# Patient Record
Sex: Female | Born: 1980 | Race: Black or African American | Hispanic: No | Smoking: Current every day smoker
Health system: Southern US, Community
[De-identification: ages and names within clinical notes are randomized; demographics above are authoritative.]

## PROBLEM LIST (undated history)

## (undated) DIAGNOSIS — N920 Excessive and frequent menstruation with regular cycle: Secondary | ICD-10-CM

## (undated) DIAGNOSIS — D649 Anemia, unspecified: Secondary | ICD-10-CM

---
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
## 1999-02-01 ENCOUNTER — Inpatient Hospital Stay (HOSPITAL_COMMUNITY): Admission: AD | Admit: 1999-02-01 | Discharge: 1999-02-01 | Payer: Self-pay | Admitting: *Deleted

## 1999-03-22 ENCOUNTER — Encounter: Admission: RE | Admit: 1999-03-22 | Discharge: 1999-06-20 | Payer: Self-pay | Admitting: Family Medicine

## 1999-04-26 ENCOUNTER — Inpatient Hospital Stay (HOSPITAL_COMMUNITY): Admission: AD | Admit: 1999-04-26 | Discharge: 1999-04-26 | Payer: Self-pay | Admitting: *Deleted

## 1999-07-24 ENCOUNTER — Inpatient Hospital Stay (HOSPITAL_COMMUNITY): Admission: AD | Admit: 1999-07-24 | Discharge: 1999-07-24 | Payer: Self-pay | Admitting: *Deleted

## 1999-10-15 ENCOUNTER — Inpatient Hospital Stay (HOSPITAL_COMMUNITY): Admission: EM | Admit: 1999-10-15 | Discharge: 1999-10-15 | Payer: Self-pay | Admitting: *Deleted

## 2000-11-04 ENCOUNTER — Emergency Department (HOSPITAL_COMMUNITY): Admission: EM | Admit: 2000-11-04 | Discharge: 2000-11-05 | Payer: Self-pay | Admitting: Emergency Medicine

## 2000-11-05 ENCOUNTER — Encounter: Payer: Self-pay | Admitting: Emergency Medicine

## 2001-01-12 ENCOUNTER — Inpatient Hospital Stay (HOSPITAL_COMMUNITY): Admission: AD | Admit: 2001-01-12 | Discharge: 2001-01-12 | Payer: Self-pay | Admitting: *Deleted

## 2001-05-22 ENCOUNTER — Emergency Department (HOSPITAL_COMMUNITY): Admission: EM | Admit: 2001-05-22 | Discharge: 2001-05-22 | Payer: Self-pay | Admitting: Internal Medicine

## 2002-02-16 ENCOUNTER — Encounter: Payer: Self-pay | Admitting: Emergency Medicine

## 2002-02-16 ENCOUNTER — Emergency Department (HOSPITAL_COMMUNITY): Admission: EM | Admit: 2002-02-16 | Discharge: 2002-02-16 | Payer: Self-pay | Admitting: *Deleted

## 2002-06-23 ENCOUNTER — Encounter: Payer: Self-pay | Admitting: Family Medicine

## 2002-06-23 ENCOUNTER — Ambulatory Visit (HOSPITAL_COMMUNITY): Admission: RE | Admit: 2002-06-23 | Discharge: 2002-06-23 | Payer: Self-pay | Admitting: Family Medicine

## 2002-07-19 ENCOUNTER — Other Ambulatory Visit: Admission: RE | Admit: 2002-07-19 | Discharge: 2002-07-19 | Payer: Self-pay | Admitting: Obstetrics & Gynecology

## 2002-07-19 ENCOUNTER — Other Ambulatory Visit: Admission: RE | Admit: 2002-07-19 | Discharge: 2002-07-19 | Payer: Self-pay | Admitting: Obstetrics and Gynecology

## 2003-02-09 ENCOUNTER — Emergency Department (HOSPITAL_COMMUNITY): Admission: EM | Admit: 2003-02-09 | Discharge: 2003-02-10 | Payer: Self-pay | Admitting: Emergency Medicine

## 2003-07-05 ENCOUNTER — Inpatient Hospital Stay (HOSPITAL_COMMUNITY): Admission: AD | Admit: 2003-07-05 | Discharge: 2003-07-08 | Payer: Self-pay | Admitting: *Deleted

## 2003-08-16 ENCOUNTER — Other Ambulatory Visit: Admission: RE | Admit: 2003-08-16 | Discharge: 2003-08-16 | Payer: Self-pay | Admitting: *Deleted

## 2006-01-14 ENCOUNTER — Emergency Department (HOSPITAL_COMMUNITY): Admission: EM | Admit: 2006-01-14 | Discharge: 2006-01-14 | Payer: Self-pay | Admitting: Family Medicine

## 2006-06-12 ENCOUNTER — Emergency Department (HOSPITAL_COMMUNITY): Admission: EM | Admit: 2006-06-12 | Discharge: 2006-06-12 | Payer: Self-pay | Admitting: Family Medicine

## 2006-06-15 ENCOUNTER — Emergency Department (HOSPITAL_COMMUNITY): Admission: EM | Admit: 2006-06-15 | Discharge: 2006-06-16 | Payer: Self-pay | Admitting: Emergency Medicine

## 2007-05-08 ENCOUNTER — Emergency Department (HOSPITAL_COMMUNITY): Admission: EM | Admit: 2007-05-08 | Discharge: 2007-05-09 | Payer: Self-pay | Admitting: Emergency Medicine

## 2007-09-07 ENCOUNTER — Ambulatory Visit (HOSPITAL_COMMUNITY): Admission: RE | Admit: 2007-09-07 | Discharge: 2007-09-07 | Payer: Self-pay | Admitting: Obstetrics & Gynecology

## 2007-09-07 HISTORY — PX: IUD REMOVAL: SHX5392

## 2007-09-07 HISTORY — PX: TUBAL LIGATION: SHX77

## 2008-04-22 ENCOUNTER — Ambulatory Visit (HOSPITAL_COMMUNITY): Admission: RE | Admit: 2008-04-22 | Discharge: 2008-04-22 | Payer: Self-pay | Admitting: Obstetrics

## 2008-04-22 ENCOUNTER — Encounter: Payer: Self-pay | Admitting: Obstetrics

## 2008-04-22 HISTORY — PX: LEEP: SHX91

## 2010-12-18 ENCOUNTER — Emergency Department (HOSPITAL_COMMUNITY)
Admission: EM | Admit: 2010-12-18 | Discharge: 2010-12-18 | Payer: Self-pay | Source: Home / Self Care | Admitting: Emergency Medicine

## 2011-04-09 NOTE — Op Note (Signed)
NAMEMARGE, VANDERMEULEN               ACCOUNT NO.:  [id number]   MEDICAL RECORD NO.:  [id number]          PATIENT TYPE:  AMB   LOCATION:  SDC                           [facility]ILITY:  [facility]   PHYSICIAN:  Roseanna Rainbow, M.D.DATE OF BIRTH:  Apr 10, 1981   DATE OF PROCEDURE:  09/07/2007  DATE OF DISCHARGE:                               OPERATIVE REPORT   PREOPERATIVE DIAGNOSIS:  Multiparity, desires sterilization procedure.   POSTOPERATIVE DIAGNOSIS:  Multiparity, desires sterilization procedure.   PROCEDURES:  1. Intrauterine device removal.  2. Laparoscopic bilateral tubal ligation with fulguration.   SURGEON:  Roseanna Rainbow, MD   ANESTHESIA:  General endotracheal.   ESTIMATED BLOOD LOSS:  Minimal.   COMPLICATIONS:  None.   PROCEDURE:  The patient is taken to the operating room with an IV  running.  She was given general anesthesia and placed in the dorsal  lithotomy position and prepped and draped in the usual sterile fashion.  After a time-out had been performed, a bivalve speculum was placed in  the patient's vagina.  The IUD strings were grasped and the IUD was  removed intact.  A single-tooth tenaculum was applied to the anterior  lip of the cervix.  The Hulka manipulator was then advanced into the  uterus and secured to the anterior lip of the cervix as a means to  manipulate the uterus.  The single-tooth tenaculum was then removed as  well as the speculum.  An infraumbilical skin incision was then made.  Using the OptiView trocar, the trocar was then advanced into the  peritoneal cavity under direct visualization.  The abdomen was then  insufflated with CO2 gas.  A survey of the pelvic anatomy revealed small  peritoneal windows in the posterior cul-de-sac near the uterosacral  ligaments bilaterally.  A 2-cm segment of the mid isthmic portion of the  right fallopian tube was cauterized contiguously.  With each application  the ohmmeter was noted to go to 0.  The  left fallopian tube was  manipulated in a similar fashion.  The instruments were removed from the  abdomen.  The skin was closed with interrupted sutures of 3-0 Vicryl and  Dermabond.  The Hulka manipulator was then removed with minimal bleeding  noted from the cervix.  At the close of the procedure the instrument and  pack counts were said to be correct x2.  The patient was taken to the  PACU awake and in stable condition.      Roseanna Rainbow, M.D.  Electronically Signed     LAJ/MEDQ  D:  09/07/2007  T:  09/08/2007  Job:  [id number]

## 2011-04-09 NOTE — Op Note (Signed)
[name], Erika               ACCOUNT NO.:  [id number]   MEDICAL RECORD NO.:  [id number]          PATIENT TYPE:  AMB   LOCATION:  SDC                           [facility]:  [facility]   PHYSICIAN:  Charles A. Clearance Coots, M.D.DATE OF BIRTH:  Feb 09, 1981   DATE OF PROCEDURE:  04/22/2008  DATE OF DISCHARGE:                               OPERATIVE REPORT   PREOPERATIVE DIAGNOSIS:  CIN II-III.   POSTOPERATIVE DIAGNOSIS:  CIN II-III.   PROCEDURE:  LEEP.   SURGEON:  Charles A.  Clearance Coots, MD   ANESTHESIA:  General.   ESTIMATED BLOOD LOSS:  Minimal.   SPECIMEN:  Conization of cervix.   DISPOSITION OF SPECIMEN:  Pathology.   OPERATION IN DETAIL:  The patient was brought to the operating room and  after satisfactory general endotracheal anesthesia, the vagina was  prepped and draped in the usual sterile fashion.  A sterile LEEP  speculum was inserted in the vaginal vault and the cervix was isolated.  The 20 x 12 loop was then used to perform the LEEP conization at a  wattage of 50 watts cutting and 35 watts coag.  The conization specimen  was obtained in routine fashion without complications and submitted to  pathology for evaluation.  The edges of the cervical base were then  feathered with ball-tip electrode and the inner aspect of the base of  the cervical cone was cauterized with the ball-tip electrode to obtain  adequate hemostasis.  There was no active bleeding at the conclusion of  the procedure.  The patient tolerated the procedure well and was  transported to recovery room in satisfactory condition.      Charles A. Clearance Coots, M.D.  Electronically Signed     CAH/MEDQ  D:  04/22/2008  T:  04/23/2008  Job:  [id number]

## 2011-04-12 NOTE — Consult Note (Signed)
NAME:  Erika [name], Erika [name]                         ACCOUNT NO.:  [id number]   MEDICAL RECORD NO.:  [id number]                   PATIENT TYPE:  MAT   LOCATION:  MATC                                 [facility]:  [facility]   PHYSICIAN:  James A. Ashley Royalty, M.D.             DATE OF BIRTH:  1981/01/20   DATE OF CONSULTATION:  02/09/2003  DATE OF DISCHARGE:                                   CONSULTATION   CHIEF COMPLAINT:  Chest pain.   HISTORY OF PRESENT ILLNESS:  This is a 30 year old gravida 3, para 1, AB 1,  at 20 weeks, one day gestation.  She presented complaining of onset of chest  pain 6:30 p.m. on February 08, 2003.  She states she called the office of Dr.  Debby Bud today with the complaints and was asked to come to the office.  However, she states her job would not allow her to leave, and hence, she  presented at or about 7 p.m. unannounced at maternity admissions unit.  She  states the pain is mostly beneath her sternum, intermittent, radiating at  times to the right and other times to the left.  At no times does it radiate  to the neck or to the left arm.  She denies any orthopnea, edema, shortness  of breath, cough, sputum production, exposures, or any relationship to  eating whatsoever.  She does state it is worse with deep inspiration.   MEDICATIONS:  Vitamins.   PAST MEDICAL HISTORY:  Negative.   PAST SURGICAL HISTORY:  Negative.   ALLERGIES:  No known drug allergies.   FAMILY HISTORY:  Noncontributory.   SOCIAL HISTORY:  The patient denies the use of tobacco or alcohol.   REVIEW OF SYSTEMS:  Noncontributory.   PHYSICAL EXAMINATION:  GENERAL:  Well-developed, well-nourished, pleasant  black female, no acute distress.  VITAL SIGNS:  Temperature 98 degrees Fahrenheit, pulse 68, respirations 18,  blood pressure 134/74.  HEENT:  Normocephalic.  NECK:  Supple without thyromegaly or _____________.  LUNGS:  Clear to auscultation.  CARDIAC:  Regular rate and rhythm without murmurs,  gallops, or rubs.  BREASTS:  Exam deferred.  ABDOMEN:  Soft and nontender.  The fundus is at approximately the level of  the umbilicus.  Fetal heart tones are auscultated with Doppler.  PELVIC:  Examination is deferred.   IMPRESSION:  1. Intrauterine pregnancy at 20 weeks' gestation.  2. Chest pain - appears to be musculoskeletal in nature.  Differential     includes cardiac origin or infectious etiology (doubt).   PLAN:  I have discussed with the patient the options of obtaining additional  lab work at this facility, possibly allowing her to go home with a short  course of analgesics if indicated versus further evaluation at sister campus  such as [facility].  She agrees to the latter.  Hence, we will obtain  transfer via [facility].  Will be available for any  questions presented by the  emergency room physicians at [facility].                                                      James A. Ashley Royalty, M.D.    JAM/MEDQ  D:  02/09/2003  T:  02/09/2003  Job:  [id number]   cc:   Fayrene Fearing A. Ashley Royalty, M.D.  [address]  [place], [place] [address]  Fax: [phone]   Rosalyn Gess. Norins, M.D. [facility]

## 2011-08-21 LAB — PREGNANCY, URINE: Preg Test, Ur: NEGATIVE

## 2011-08-21 LAB — CBC
HCT: 35.2 — ABNORMAL LOW
Hemoglobin: 11.8 — ABNORMAL LOW
MCHC: 33.5
MCV: 69 — ABNORMAL LOW
Platelets: 311
RBC: 5.1
RDW: 13.9
WBC: 9.1

## 2011-09-05 LAB — PREGNANCY, URINE: Preg Test, Ur: NEGATIVE

## 2011-09-05 LAB — CBC
HCT: 33.7 — ABNORMAL LOW
Hemoglobin: 11.2 — ABNORMAL LOW
MCHC: 33.2
MCV: 69.6 — ABNORMAL LOW
Platelets: 351
RBC: 4.84
RDW: 14.6 — ABNORMAL HIGH
WBC: 6.9

## 2011-09-12 LAB — URINALYSIS, ROUTINE W REFLEX MICROSCOPIC
Bilirubin Urine: NEGATIVE
Glucose, UA: NEGATIVE
Hgb urine dipstick: NEGATIVE
Ketones, ur: 15 — AB
Nitrite: NEGATIVE
Protein, ur: NEGATIVE
Specific Gravity, Urine: 1.028
Urobilinogen, UA: 0.2
pH: 5.5

## 2011-09-12 LAB — WET PREP, GENITAL
Clue Cells Wet Prep HPF POC: NONE SEEN
Trich, Wet Prep: NONE SEEN
Yeast Wet Prep HPF POC: NONE SEEN

## 2011-09-12 LAB — PREGNANCY, URINE: Preg Test, Ur: NEGATIVE

## 2011-09-12 LAB — GC/CHLAMYDIA PROBE AMP, GENITAL
Chlamydia, DNA Probe: NEGATIVE
GC Probe Amp, Genital: NEGATIVE

## 2012-06-24 ENCOUNTER — Emergency Department (HOSPITAL_COMMUNITY)
Admission: EM | Admit: 2012-06-24 | Discharge: 2012-06-24 | Disposition: A | Discharge status: Home / Self Care | Payer: Self-pay | Attending: Emergency Medicine | Admitting: Emergency Medicine

## 2012-06-24 ENCOUNTER — Encounter (HOSPITAL_COMMUNITY): Payer: Self-pay

## 2012-06-24 DIAGNOSIS — Z87891 Personal history of nicotine dependence: Secondary | ICD-10-CM | POA: Insufficient documentation

## 2012-06-24 DIAGNOSIS — N12 Tubulo-interstitial nephritis, not specified as acute or chronic: Secondary | ICD-10-CM | POA: Insufficient documentation

## 2012-06-24 DIAGNOSIS — I10 Essential (primary) hypertension: Secondary | ICD-10-CM | POA: Insufficient documentation

## 2012-06-24 LAB — URINALYSIS, ROUTINE W REFLEX MICROSCOPIC
Bilirubin Urine: NEGATIVE
Glucose, UA: NEGATIVE mg/dL
Ketones, ur: 15 mg/dL — AB
Nitrite: POSITIVE — AB
Protein, ur: 100 mg/dL — AB
Specific Gravity, Urine: 1.017 (ref 1.005–1.030)
Urobilinogen, UA: 0.2 mg/dL (ref 0.0–1.0)
pH: 5.5 (ref 5.0–8.0)

## 2012-06-24 LAB — URINE MICROSCOPIC-ADD ON

## 2012-06-24 LAB — PREGNANCY, URINE: Preg Test, Ur: NEGATIVE

## 2012-06-24 MED ORDER — HYDROCODONE-ACETAMINOPHEN 5-500 MG PO TABS: 1.0000 | ORAL_TABLET | Freq: Four times a day (QID) | ORAL | Status: AC | PRN

## 2012-06-24 MED ORDER — IBUPROFEN 600 MG PO TABS: 600.0000 mg | ORAL_TABLET | Freq: Four times a day (QID) | ORAL | Status: AC | PRN

## 2012-06-24 MED ORDER — CIPROFLOXACIN HCL 500 MG PO TABS
500.0000 mg | ORAL_TABLET | Freq: Once | ORAL | Status: AC
  Administered 2012-06-24: 500 mg via ORAL
  Filled 2012-06-24: qty 1

## 2012-06-24 MED ORDER — PHENAZOPYRIDINE HCL 200 MG PO TABS: 200.0000 mg | ORAL_TABLET | Freq: Three times a day (TID) | ORAL | Status: AC

## 2012-06-24 MED ORDER — CIPROFLOXACIN HCL 500 MG PO TABS: 500.0000 mg | ORAL_TABLET | Freq: Two times a day (BID) | ORAL | Status: AC

## 2012-06-24 MED ORDER — PHENAZOPYRIDINE HCL 200 MG PO TABS
200.0000 mg | ORAL_TABLET | Freq: Three times a day (TID) | ORAL | Status: DC
  Administered 2012-06-24: 200 mg via ORAL
  Filled 2012-06-24: qty 1

## 2012-06-24 NOTE — ED Notes (Signed)
Patient reports that she is having dysuria, odor to urine, and right low back pain with nausea last night. Patient denies fever.

## 2012-06-24 NOTE — ED Provider Notes (Signed)
History     CSN: [id number]  Arrival date & time 06/24/12  1241   First MD Initiated Contact with Patient 06/24/12 1357      Chief Complaint  Patient presents with  . Urinary Frequency  . Nausea  . Back Pain    (Consider location/radiation/quality/duration/timing/severity/associated sxs/prior treatment) HPI Comments: Erika [name] is a 31 y.o. Female who presents with urinary frequency, right flank pain, nausea, chills. Pt states she first started having urinary symptoms about a month ago, but they improved with no treatment. States in the last two days having right flank pain, nausea, pain with movement, urination. No fever, no vomiting. No vaginal discharge or bleeding. Not pregnant. Pt did not take any medication prior to arrival.    The history is provided by the patient.    Past Medical History  Diagnosis Date  . Hypertension     Past Surgical History  Procedure Date  . Tubal ligation   . Iud removal     Family History  Problem Relation Age of Onset  . Cancer Mother   . Diabetes Father     History  Substance Use Topics  . Smoking status: Former Games developer  . Smokeless tobacco: Not on file  . Alcohol Use: Yes     occasionally    OB History    Grav Para Term Preterm Abortions TAB SAB Ect Mult Living                  Review of Systems  Constitutional: Negative for fever and chills.  HENT: Negative for neck pain and neck stiffness.   Respiratory: Negative for cough, choking and chest tightness.   Gastrointestinal: Positive for abdominal pain. Negative for nausea and vomiting.  Genitourinary: Positive for dysuria, urgency, frequency and flank pain. Negative for hematuria, vaginal bleeding, vaginal discharge and difficulty urinating.  Musculoskeletal: Positive for back pain.  Skin: Negative.   Neurological: Negative for weakness, numbness and headaches.    Allergies  Review of patient's allergies indicates no known allergies.  Home Medications    Current Outpatient Rx  Name Route Sig Dispense Refill  . IBUPROFEN 200 MG PO TABS Oral Take 200 mg by mouth every 6 (six) hours as needed.      BP 146/84  Pulse 92  Temp 98.1 F (36.7 C) (Oral)  Resp 16  SpO2 100%  LMP 04/27/2012  Physical Exam  Nursing note and vitals reviewed. Constitutional: She is oriented to person, place, and time. She appears well-developed and well-nourished. No distress.  HENT:  Head: Normocephalic.  Eyes: Conjunctivae are normal.  Neck: Neck supple.  Cardiovascular: Normal rate, regular rhythm and normal heart sounds.   Pulmonary/Chest: Effort normal and breath sounds normal. No respiratory distress. She has no wheezes. She has no rales.  Abdominal: Soft. Bowel sounds are normal. She exhibits no distension. There is tenderness. There is no rebound.       Suprapubic tenderness, no guarding. Right CVA tenderness  Musculoskeletal: She exhibits no edema.  Neurological: She is alert and oriented to person, place, and time.  Skin: Skin is warm and dry.  Psychiatric: She has a normal mood and affect.    ED Course  Procedures (including critical care time)  Pt with urinary symptoms. Pain with right CVA percussion. VS normal. No vomiting, afebrile. Suspect early pyelo, urine infected  Results for orders placed during the hospital encounter of 06/24/12  URINALYSIS, ROUTINE W REFLEX MICROSCOPIC      Component Value Range  Color, Urine YELLOW  YELLOW   APPearance CLOUDY (*) CLEAR   Specific Gravity, Urine 1.017  1.005 - 1.030   pH 5.5  5.0 - 8.0   Glucose, UA NEGATIVE  NEGATIVE mg/dL   Hgb urine dipstick LARGE (*) NEGATIVE   Bilirubin Urine NEGATIVE  NEGATIVE   Ketones, ur 15 (*) NEGATIVE mg/dL   Protein, ur 409 (*) NEGATIVE mg/dL   Urobilinogen, UA 0.2  0.0 - 1.0 mg/dL   Nitrite POSITIVE (*) NEGATIVE   Leukocytes, UA LARGE (*) NEGATIVE  URINE MICROSCOPIC-ADD ON      Component Value Range   Squamous Epithelial / LPF RARE  RARE   WBC, UA TOO  NUMEROUS TO COUNT  <3 WBC/hpf   RBC / HPF 3-6  <3 RBC/hpf   Bacteria, UA MANY (*) RARE   No results found.  Will send cultures, will treat with antibiotics, pain medications, follow up closely. Instructed to return if worsening. PT denies vaginal symptoms and does not wish to have pelvic exam done at this time.   1. Pyelonephritis       MDM          Lottie Mussel, PA 06/25/12 [phone]

## 2012-06-26 LAB — URINE CULTURE: Colony Count: >=100000

## 2012-06-26 NOTE — ED Provider Notes (Signed)
Medical screening examination/treatment/procedure(s) were performed by non-physician practitioner and as supervising physician I was immediately available for consultation/collaboration.  Malan Werk, MD 06/26/12 1319 

## 2012-06-27 NOTE — ED Notes (Signed)
+  Urine. Patient treated with Cipro. Sensitive to same. Per protocol MD. °

## 2012-08-05 ENCOUNTER — Inpatient Hospital Stay (HOSPITAL_COMMUNITY)
Admission: AD | Admit: 2012-08-05 | Discharge: 2012-08-05 | Disposition: A | Discharge status: Home / Self Care | Payer: Self-pay | Source: Ambulatory Visit | Attending: Obstetrics & Gynecology | Admitting: Obstetrics & Gynecology

## 2012-08-05 ENCOUNTER — Encounter: Payer: Self-pay | Admitting: *Deleted

## 2012-08-05 DIAGNOSIS — N949 Unspecified condition associated with female genital organs and menstrual cycle: Secondary | ICD-10-CM | POA: Insufficient documentation

## 2012-08-05 DIAGNOSIS — R102 Pelvic and perineal pain: Secondary | ICD-10-CM

## 2012-08-05 DIAGNOSIS — R109 Unspecified abdominal pain: Secondary | ICD-10-CM | POA: Insufficient documentation

## 2012-08-05 DIAGNOSIS — N72 Inflammatory disease of cervix uteri: Secondary | ICD-10-CM

## 2012-08-05 LAB — URINALYSIS, ROUTINE W REFLEX MICROSCOPIC
Bilirubin Urine: NEGATIVE
Glucose, UA: NEGATIVE mg/dL
Ketones, ur: NEGATIVE mg/dL
Leukocytes, UA: NEGATIVE
Nitrite: NEGATIVE
Protein, ur: NEGATIVE mg/dL
Specific Gravity, Urine: 1.025 (ref 1.005–1.030)
Urobilinogen, UA: 0.2 mg/dL (ref 0.0–1.0)
pH: 6 (ref 5.0–8.0)

## 2012-08-05 LAB — CBC WITH DIFFERENTIAL/PLATELET
Basophils Absolute: 0 10*3/uL (ref 0.0–0.1)
Basophils Relative: 0 % (ref 0–1)
Eosinophils Absolute: 0.5 10*3/uL (ref 0.0–0.7)
Eosinophils Relative: 7 % — ABNORMAL HIGH (ref 0–5)
HCT: 30.4 % — ABNORMAL LOW (ref 36.0–46.0)
Hemoglobin: 10.1 g/dL — ABNORMAL LOW (ref 12.0–15.0)
Lymphocytes Relative: 33 % (ref 12–46)
Lymphs Abs: 2.7 10*3/uL (ref 0.7–4.0)
MCH: 21.7 pg — ABNORMAL LOW (ref 26.0–34.0)
MCHC: 33.2 g/dL (ref 30.0–36.0)
MCV: 65.2 fL — ABNORMAL LOW (ref 78.0–100.0)
Monocytes Absolute: 0.6 10*3/uL (ref 0.1–1.0)
Monocytes Relative: 7 % (ref 3–12)
Neutro Abs: 4.3 10*3/uL (ref 1.7–7.7)
Neutrophils Relative %: 53 % (ref 43–77)
Platelets: 349 10*3/uL (ref 150–400)
RBC: 4.66 MIL/uL (ref 3.87–5.11)
RDW: 15 % (ref 11.5–15.5)
WBC: 8.1 10*3/uL (ref 4.0–10.5)

## 2012-08-05 LAB — URINE MICROSCOPIC-ADD ON

## 2012-08-05 LAB — WET PREP, GENITAL
Trich, Wet Prep: NONE SEEN
Yeast Wet Prep HPF POC: NONE SEEN

## 2012-08-05 MED ORDER — AZITHROMYCIN 250 MG PO TABS
1000.0000 mg | ORAL_TABLET | Freq: Once | ORAL | Status: AC
  Administered 2012-08-05: 1000 mg via ORAL
  Filled 2012-08-05: qty 4

## 2012-08-05 MED ORDER — CEFTRIAXONE SODIUM 250 MG IJ SOLR
250.0000 mg | Freq: Once | INTRAMUSCULAR | Status: AC
  Administered 2012-08-05: 250 mg via INTRAMUSCULAR
  Filled 2012-08-05: qty 250

## 2012-08-05 MED ORDER — KETOROLAC TROMETHAMINE 60 MG/2ML IM SOLN
30.0000 mg | Freq: Once | INTRAMUSCULAR | Status: AC
  Administered 2012-08-05: 30 mg via INTRAMUSCULAR
  Filled 2012-08-05: qty 2

## 2012-08-05 MED ORDER — HYDROCODONE-ACETAMINOPHEN 5-325 MG PO TABS: 1.0000 | ORAL_TABLET | ORAL | Status: AC | PRN

## 2012-08-05 NOTE — MAU Note (Signed)
Pt presents with complaint of "I am having some abnormal cramping." states just this pm/am. Denies dysuria, LMP 07/30/2012, nausea

## 2012-08-05 NOTE — MAU Provider Note (Signed)
History     CSN: [id number]  Arrival date and time: 08/05/12 0207   First Provider Initiated Contact with Patient 08/05/12 0256      Chief Complaint  Patient presents with  . Abdominal Pain   HPI Erika [name] is a 31 y.o. female who presents to MAU with abdominal pain. She describes the pain as cramping. The pain started tonight approximately 8 pm but has gotten worse. LMP 07/30/12. No birth control. Sexually active, current sex partner x 6 months. No pain with intercourse, last intercourse 2 days ago. Tubal ligation for birth control. Last pap smear 6 years ago and was abnormal. Never went back for follow up due to loss of insurance.  The history was provided by the patient.  OB History    Grav Para Term Preterm Abortions TAB SAB Ect Mult Living                  Past Medical History  Diagnosis Date  . Hypertension     Past Surgical History  Procedure Date  . Tubal ligation   . Iud removal     Family History  Problem Relation Age of Onset  . Cancer Mother   . Diabetes Father     History  Substance Use Topics  . Smoking status: Former Games developer  . Smokeless tobacco: Not on file  . Alcohol Use: Yes     occasionally    Allergies: No Known Allergies  Prescriptions prior to admission  Medication Sig Dispense Refill  . ibuprofen (ADVIL,MOTRIN) 200 MG tablet Take 200 mg by mouth every 6 (six) hours as needed.        Review of Systems  Constitutional: Negative for fever, chills and weight loss.  HENT: Negative for ear pain, nosebleeds, congestion, sore throat and neck pain.   Eyes: Negative for blurred vision, double vision, photophobia and pain.  Respiratory: Negative for cough, shortness of breath and wheezing.   Cardiovascular: Negative for chest pain, palpitations and leg swelling.  Gastrointestinal: Positive for heartburn and abdominal pain. Negative for nausea, vomiting, diarrhea and constipation.       Vaginal discharge and spotting  Genitourinary: Positive  for frequency. Negative for dysuria and urgency.  Musculoskeletal: Positive for back pain. Negative for myalgias.  Skin: Negative for itching and rash.  Neurological: Positive for headaches. Negative for dizziness, sensory change, speech change, seizures and weakness.  Endo/Heme/Allergies: Does not bruise/bleed easily.  Psychiatric/Behavioral: Negative for depression. The patient is not nervous/anxious and does not have insomnia.    Physical Exam   Blood pressure 149/90, pulse 79, temperature 98.1 F (36.7 C), temperature source Oral, resp. rate 18, height 5\' 4"  (1.626 m), weight 159 lb (72.122 kg), SpO2 100.00%.  Physical Exam  Nursing note and vitals reviewed. Constitutional: She is oriented to person, place, and time. She appears well-developed and well-nourished. No distress.  HENT:  Head: Normocephalic and atraumatic.  Eyes: EOM are normal.  Neck: Neck supple.  Cardiovascular: Normal rate.   Respiratory: Effort normal.  GI: Soft. There is no tenderness.  Genitourinary:       External genitalia without lesions. Frothy blood tinged discharge vaginal vault. Cervix inflamed, positive CMT, mild bilateral adnexal tenderness. Uterus without palpable enlargement.  Musculoskeletal: Normal range of motion.  Neurological: She is alert and oriented to person, place, and time.  Skin: Skin is warm and dry.  Psychiatric: She has a normal mood and affect. Her behavior is normal. Judgment and thought content normal.   Results for  orders placed during the hospital encounter of 08/05/12 (from the past 24 hour(s))  URINALYSIS, ROUTINE W REFLEX MICROSCOPIC     Status: Abnormal   Collection Time   08/05/12  1:40 AM      Component Value Range   Color, Urine YELLOW  YELLOW   APPearance CLEAR  CLEAR   Specific Gravity, Urine 1.025  1.005 - 1.030   pH 6.0  5.0 - 8.0   Glucose, UA NEGATIVE  NEGATIVE mg/dL   Hgb urine dipstick SMALL (*) NEGATIVE   Bilirubin Urine NEGATIVE  NEGATIVE   Ketones, ur  NEGATIVE  NEGATIVE mg/dL   Protein, ur NEGATIVE  NEGATIVE mg/dL   Urobilinogen, UA 0.2  0.0 - 1.0 mg/dL   Nitrite NEGATIVE  NEGATIVE   Leukocytes, UA NEGATIVE  NEGATIVE  URINE MICROSCOPIC-ADD ON     Status: Abnormal   Collection Time   08/05/12  1:40 AM      Component Value Range   Squamous Epithelial / LPF FEW (*) RARE   WBC, UA 0-2  <3 WBC/hpf   RBC / HPF 3-6  <3 RBC/hpf   Bacteria, UA FEW (*) RARE  CBC WITH DIFFERENTIAL     Status: Abnormal   Collection Time   08/05/12  3:20 AM      Component Value Range   WBC 8.1  4.0 - 10.5 K/uL   RBC 4.66  3.87 - 5.11 MIL/uL   Hemoglobin 10.1 (*) 12.0 - 15.0 g/dL   HCT 16.1 (*) 09.6 - 04.5 %   MCV 65.2 (*) 78.0 - 100.0 fL   MCH 21.7 (*) 26.0 - 34.0 pg   MCHC 33.2  30.0 - 36.0 g/dL   RDW 40.9  81.1 - 91.4 %   Platelets 349  150 - 400 K/uL   Neutrophils Relative 53  43 - 77 %   Neutro Abs 4.3  1.7 - 7.7 K/uL   Lymphocytes Relative 33  12 - 46 %   Lymphs Abs 2.7  0.7 - 4.0 K/uL   Monocytes Relative 7  3 - 12 %   Monocytes Absolute 0.6  0.1 - 1.0 K/uL   Eosinophils Relative 7 (*) 0 - 5 %   Eosinophils Absolute 0.5  0.0 - 0.7 K/uL   Basophils Relative 0  0 - 1 %   Basophils Absolute 0.0  0.0 - 0.1 K/uL   Assessment: 31 y.o. female with pelvic pain   Cervcitis  Plan:  Rocephin 250 mg IM   Zithromax 1 gram po   Follow up in GYN clinic Discussed with the patient and all questioned fully answered. She will follow up in GYN Clinic or return here if any problems arise.   Medication List     As of 08/08/2012  7:58 AM    START taking these medications         HYDROcodone-acetaminophen 5-325 MG per tablet   Commonly known as: NORCO/VICODIN   Take 1 tablet by mouth every 4 (four) hours as needed for pain.      CONTINUE taking these medications         ibuprofen 200 MG tablet   Commonly known as: ADVIL,MOTRIN          Where to get your medications    These are the prescriptions that you need to pick up.   You may get these  medications from any pharmacy.         HYDROcodone-acetaminophen 5-325 MG per tablet  Procedures   Dupree Givler, RN, FNP, [facility] 08/05/2012, 2:56 AM

## 2012-08-06 LAB — GC/CHLAMYDIA PROBE AMP, GENITAL
Chlamydia, DNA Probe: NEGATIVE
GC Probe Amp, Genital: NEGATIVE

## 2012-08-26 ENCOUNTER — Encounter: Payer: Self-pay | Admitting: Obstetrics & Gynecology

## 2012-09-10 ENCOUNTER — Encounter: Payer: Self-pay | Admitting: Advanced Practice Midwife

## 2012-12-30 ENCOUNTER — Inpatient Hospital Stay (HOSPITAL_COMMUNITY)
Admission: AD | Admit: 2012-12-30 | Discharge: 2012-12-30 | Disposition: A | Discharge status: Home / Self Care | Payer: 59 | Source: Ambulatory Visit | Attending: Obstetrics & Gynecology | Admitting: Obstetrics & Gynecology

## 2012-12-30 ENCOUNTER — Encounter (HOSPITAL_COMMUNITY): Payer: Self-pay

## 2012-12-30 ENCOUNTER — Inpatient Hospital Stay (HOSPITAL_COMMUNITY): Payer: 59

## 2012-12-30 DIAGNOSIS — D259 Leiomyoma of uterus, unspecified: Secondary | ICD-10-CM | POA: Insufficient documentation

## 2012-12-30 DIAGNOSIS — N946 Dysmenorrhea, unspecified: Secondary | ICD-10-CM | POA: Insufficient documentation

## 2012-12-30 DIAGNOSIS — R109 Unspecified abdominal pain: Secondary | ICD-10-CM | POA: Insufficient documentation

## 2012-12-30 LAB — POCT PREGNANCY, URINE: Preg Test, Ur: NEGATIVE

## 2012-12-30 LAB — CBC
HCT: 31 % — ABNORMAL LOW (ref 36.0–46.0)
Hemoglobin: 10.3 g/dL — ABNORMAL LOW (ref 12.0–15.0)
MCH: 21.9 pg — ABNORMAL LOW (ref 26.0–34.0)
MCHC: 33.2 g/dL (ref 30.0–36.0)
MCV: 65.8 fL — ABNORMAL LOW (ref 78.0–100.0)
Platelets: 321 10*3/uL (ref 150–400)
RBC: 4.71 MIL/uL (ref 3.87–5.11)
RDW: 15.5 % (ref 11.5–15.5)
WBC: 7.8 10*3/uL (ref 4.0–10.5)

## 2012-12-30 LAB — URINE MICROSCOPIC-ADD ON

## 2012-12-30 LAB — URINALYSIS, ROUTINE W REFLEX MICROSCOPIC
Bilirubin Urine: NEGATIVE
Glucose, UA: NEGATIVE mg/dL
Ketones, ur: NEGATIVE mg/dL
Leukocytes, UA: NEGATIVE
Nitrite: NEGATIVE
Protein, ur: NEGATIVE mg/dL
Specific Gravity, Urine: >1.03 — ABNORMAL HIGH (ref 1.005–1.030)
Urobilinogen, UA: 0.2 mg/dL (ref 0.0–1.0)
pH: 6 (ref 5.0–8.0)

## 2012-12-30 LAB — WET PREP, GENITAL
Clue Cells Wet Prep HPF POC: NONE SEEN
Trich, Wet Prep: NONE SEEN
Yeast Wet Prep HPF POC: NONE SEEN

## 2012-12-30 LAB — HCG, QUANTITATIVE, PREGNANCY: hCG, Beta Chain, Quant, S: 2 m[IU]/mL (ref <5)

## 2012-12-30 MED ORDER — ACETAMINOPHEN 500 MG PO TABS
1000.0000 mg | ORAL_TABLET | Freq: Once | ORAL | Status: AC
  Administered 2012-12-30: 1000 mg via ORAL
  Filled 2012-12-30: qty 2

## 2012-12-30 NOTE — MAU Note (Signed)
preg symptoms for past 2 wks.  Hx of tubal in 2007,  Pain started this morning- left side only.

## 2012-12-30 NOTE — MAU Provider Note (Signed)
History     CSN: [id number]  Arrival date and time: 12/30/12 1412   First Provider Initiated Contact with Patient 12/30/12 1543      Chief Complaint  Patient presents with  . Abdominal Pain   HPI Erika [name] 32 y.o. Comes to MAU with sudden onset of severe left sided lower abdominal pain for past 2 hours.  Took Ibuprofen 800 mg PO and has had no relief.  Worried that this would be a tubal pregnancy.  Has had pregnancy symptoms x 2 weeks.  OB History    Grav Para Term Preterm Abortions TAB SAB Ect Mult Living   3 2 2  1  1   2       Past Medical History  Diagnosis Date  . Hypertension     Past Surgical History  Procedure Date  . Tubal ligation   . Iud removal     Family History  Problem Relation Age of Onset  . Cancer Mother   . Diabetes Father     History  Substance Use Topics  . Smoking status: Former Games developer  . Smokeless tobacco: Not on file  . Alcohol Use: Yes     Comment: occasionally    Allergies: No Known Allergies  Prescriptions prior to admission  Medication Sig Dispense Refill  . ibuprofen (ADVIL,MOTRIN) 800 MG tablet Take 800 mg by mouth every 8 (eight) hours as needed. pain      . Prenatal Vit-Fe Fumarate-FA (PRENATAL MULTIVITAMIN) TABS Take 1 tablet by mouth daily.        Review of Systems  Constitutional: Negative for fever.  Gastrointestinal: Positive for abdominal pain. Negative for nausea and vomiting.       Mild abdominal pain  Genitourinary:       No vaginal discharge. No vaginal bleeding. No dysuria.   Physical Exam   Blood pressure 122/70, pulse 63, temperature 98.5 F (36.9 C), temperature source Oral, resp. rate 18, height 5\' 4"  (1.626 m), weight 78.291 kg (172 lb 9.6 oz), last menstrual period 12/04/2012.  Physical Exam  Nursing note and vitals reviewed. Constitutional: She is oriented to person, place, and time. She appears well-developed and well-nourished. No distress.  HENT:  Head: Normocephalic.  Eyes: EOM are  normal.  Neck: Neck supple.  GI: Soft. There is tenderness.  Genitourinary:       Speculum exam: Vagina - No discharge, no odor Cervix - No contact bleeding Bimanual exam: Cervix closed Uterus very  Tender with palpation, normal size Adnexa non tender, no masses bilaterally GC/Chlam, wet prep done Chaperone present for exam.  Musculoskeletal: Normal range of motion.  Neurological: She is alert and oriented to person, place, and time.  Skin: Skin is warm and dry.  Psychiatric: She has a normal mood and affect.    MAU Course  Procedures Results for orders placed during the hospital encounter of 12/30/12 (from the past 24 hour(s))  URINALYSIS, ROUTINE W REFLEX MICROSCOPIC     Status: Abnormal   Collection Time   12/30/12  2:29 PM      Component Value Range   Color, Urine YELLOW  YELLOW   APPearance CLEAR  CLEAR   Specific Gravity, Urine >1.030 (*) 1.005 - 1.030   pH 6.0  5.0 - 8.0   Glucose, UA NEGATIVE  NEGATIVE mg/dL   Hgb urine dipstick TRACE (*) NEGATIVE   Bilirubin Urine NEGATIVE  NEGATIVE   Ketones, ur NEGATIVE  NEGATIVE mg/dL   Protein, ur NEGATIVE  NEGATIVE mg/dL  Urobilinogen, UA 0.2  0.0 - 1.0 mg/dL   Nitrite NEGATIVE  NEGATIVE   Leukocytes, UA NEGATIVE  NEGATIVE  URINE MICROSCOPIC-ADD ON     Status: Abnormal   Collection Time   12/30/12  2:29 PM      Component Value Range   Squamous Epithelial / LPF RARE  RARE   WBC, UA 0-2  <3 WBC/hpf   RBC / HPF 0-2  <3 RBC/hpf   Bacteria, UA FEW (*) RARE   Urine-Other MUCOUS PRESENT    POCT PREGNANCY, URINE     Status: Normal   Collection Time   12/30/12  2:42 PM      Component Value Range   Preg Test, Ur NEGATIVE  NEGATIVE  WET PREP, GENITAL     Status: Abnormal   Collection Time   12/30/12  3:50 PM      Component Value Range   Yeast Wet Prep HPF POC NONE SEEN  NONE SEEN   Trich, Wet Prep NONE SEEN  NONE SEEN   Clue Cells Wet Prep HPF POC NONE SEEN  NONE SEEN   WBC, Wet Prep HPF POC FEW (*) NONE SEEN  CBC     Status:  Abnormal   Collection Time   12/30/12  4:10 PM      Component Value Range   WBC 7.8  4.0 - 10.5 K/uL   RBC 4.71  3.87 - 5.11 MIL/uL   Hemoglobin 10.3 (*) 12.0 - 15.0 g/dL   HCT 16.1 (*) 09.6 - 04.5 %   MCV 65.8 (*) 78.0 - 100.0 fL   MCH 21.9 (*) 26.0 - 34.0 pg   MCHC 33.2  30.0 - 36.0 g/dL   RDW 40.9  81.1 - 91.4 %   Platelets 321  150 - 400 K/uL  HCG, QUANTITATIVE, PREGNANCY     Status: Normal   Collection Time   12/30/12  4:10 PM      Component Value Range   hCG, Beta Chain, Quant, S 2  <5 mIU/mL   MDM *RADIOLOGY REPORT*  Clinical Data: Acute onset left-sided abdominal pain.  TRANSABDOMINAL AND TRANSVAGINAL ULTRASOUND OF PELVIS  Technique: Both transabdominal and transvaginal ultrasound  examinations of the pelvis were performed. Transabdominal  technique was performed for global imaging of the pelvis including  uterus, ovaries, adnexal regions, and pelvic cul-de-sac.  It was necessary to proceed with endovaginal exam following the  transabdominal exam to visualize the endometrium and right adnexal  cyst.  Comparison: None.  Findings:  Uterus: 8.4 x 4.8 x 5.5 cm. A tiny 1.0 cm fibroid is seen in the  posterior uterine body. No other fibroids identified.  Endometrium: Double layer thickness measures 6 mm transvaginally.  No focal lesion visualized.  Right ovary: 3.7 x 3.1 x 3.4 cm. A 3 cm follicle is noted. No  ovarian or adnexal mass identified.  Left ovary: 2.8 x 1.7 x 2.3 cm. Normal appearance. No adnexal  mass identified.  Other Findings: No free fluid  IMPRESSION:  1. 1 cm posterior uterine fibroid.  2. 3 cm right ovarian follicle noted. No ovarian or adnexal mass  identified.  Original Report Authenticated By: Myles Rosenthal, M.D.    Assessment and Plan  Abdominal pain - likely beginning dysmenorrhea as menses due in 5 days. Small uterine fibroid  Plan Has appointment scheduled later this month in the office Continue ibuprofen for next 24 hours. Drink at least 8  8-oz glasses of water every day. Increase dietary fiber so stools are soft.  Donnie Gedeon 12/30/2012, 4:00 PM

## 2012-12-30 NOTE — MAU Note (Signed)
Took an ibuprofen isn't touching it.

## 2012-12-31 LAB — GC/CHLAMYDIA PROBE AMP
CT Probe RNA: NEGATIVE
GC Probe RNA: NEGATIVE

## 2013-02-19 ENCOUNTER — Other Ambulatory Visit: Payer: Self-pay | Admitting: *Deleted

## 2013-02-19 NOTE — Telephone Encounter (Signed)
Pt requesting a refill on her birth control pills

## 2013-02-26 ENCOUNTER — Telehealth: Payer: Self-pay | Admitting: *Deleted

## 2013-02-26 NOTE — Telephone Encounter (Signed)
Call to patient- left message that her Rx request for OCP has been forwarded to the pharmacy listed- [facility]

## 2013-03-01 ENCOUNTER — Ambulatory Visit: Payer: Self-pay | Admitting: Obstetrics

## 2013-03-01 NOTE — Telephone Encounter (Signed)
Rx called to the pharmacy per June Leap., RN

## 2013-03-25 ENCOUNTER — Encounter: Payer: Self-pay | Admitting: Obstetrics

## 2013-03-25 ENCOUNTER — Other Ambulatory Visit: Payer: Self-pay | Admitting: Obstetrics

## 2013-03-25 ENCOUNTER — Ambulatory Visit (INDEPENDENT_AMBULATORY_CARE_PROVIDER_SITE_OTHER): Payer: 59 | Admitting: Obstetrics

## 2013-03-25 VITALS — BP 132/90 | HR 90 | Temp 98.7°F | Ht 63.0 in | Wt 165.0 lb

## 2013-03-25 DIAGNOSIS — Z3041 Encounter for surveillance of contraceptive pills: Secondary | ICD-10-CM | POA: Insufficient documentation

## 2013-03-25 DIAGNOSIS — N946 Dysmenorrhea, unspecified: Secondary | ICD-10-CM | POA: Insufficient documentation

## 2013-03-25 DIAGNOSIS — Z01419 Encounter for gynecological examination (general) (routine) without abnormal findings: Secondary | ICD-10-CM

## 2013-03-25 DIAGNOSIS — N390 Urinary tract infection, site not specified: Secondary | ICD-10-CM

## 2013-03-25 LAB — POCT URINALYSIS DIPSTICK
Bilirubin, UA: NEGATIVE
Blood, UA: NEGATIVE
Glucose, UA: NEGATIVE
Ketones, UA: NEGATIVE
Leukocytes, UA: NEGATIVE
Nitrite, UA: NEGATIVE
Protein, UA: NEGATIVE
Spec Grav, UA: 1.015
Urobilinogen, UA: NEGATIVE
pH, UA: 6

## 2013-03-25 MED ORDER — NORGESTIMATE-ETH ESTRADIOL 0.25-35 MG-MCG PO TABS: 1.0000 | ORAL_TABLET | Freq: Every day | ORAL | Status: DC

## 2013-03-25 MED ORDER — IBUPROFEN 600 MG PO TABS: 600.0000 mg | ORAL_TABLET | Freq: Four times a day (QID) | ORAL | Status: DC | PRN

## 2013-03-25 MED ORDER — NITROFURANTOIN MONOHYD MACRO 100 MG PO CAPS: 100.0000 mg | ORAL_CAPSULE | Freq: Two times a day (BID) | ORAL | Status: DC

## 2013-03-25 MED ORDER — HYDROCODONE-IBUPROFEN 5-200 MG PO TABS: 1.0000 | ORAL_TABLET | Freq: Four times a day (QID) | ORAL | Status: DC | PRN

## 2013-03-25 MED ORDER — PRENATAL PLUS IRON 29-1 MG PO TABS: 1.0000 | ORAL_TABLET | Freq: Every day | ORAL | Status: DC

## 2013-03-25 NOTE — Patient Instructions (Signed)
Management of:  Dysmenorrhea Contraception Self Breast Exams Diet and Exercise

## 2013-03-25 NOTE — Progress Notes (Signed)
Subjective:     Erika [name] is a 32 y.o. female here for a routine exam.  Current complaints: Pt in office today for an annual exam. Pt states she is having burning urination and strong odor with urine. Pt states she is also having some pain with intercourse. Pt states she is currently using birth control pills to regulate her menstrual cycles   Personal health questionnaire reviewed: yes.   Gynecologic History Patient's last menstrual period was 03/17/2013. Contraception: OCP (estrogen/progesterone) and tubal ligation Last Pap: 2009. Results were: normal    Pt has history of LEEP in 2009 Last mammogram: n/a. Results were: n/a  Obstetric History OB History   Grav Para Term Preterm Abortions TAB SAB Ect Mult Living   3 2 2  1  1   2      # Outc Date GA Lbr Len/2nd Wgt Sex Del Anes PTL Lv   1 TRM            2 TRM            3 SAB                The following portions of the patient's history were reviewed and updated as appropriate: allergies, current medications, past family history, past medical history, past social history, past surgical history and problem list.  Review of Systems Pertinent items are noted in HPI.    Objective:    General appearance: alert and no distress Breasts: normal appearance, no masses or tenderness Abdomen: normal findings: soft, non-tender Pelvic: cervix normal in appearance, external genitalia normal, no adnexal masses or tenderness, no cervical motion tenderness, uterus normal size, shape, and consistency and vagina normal without discharge    Assessment:    Healthy female exam.  UTI   Plan:    Education reviewed: self breast exams. Macrobid Rx Contraception

## 2013-03-26 LAB — PAP IG W/ RFLX HPV ASCU

## 2013-03-26 LAB — WET PREP BY MOLECULAR PROBE
Candida species: NEGATIVE
Gardnerella vaginalis: POSITIVE — AB
Trichomonas vaginosis: NEGATIVE

## 2013-03-26 LAB — GC/CHLAMYDIA PROBE AMP
CT Probe RNA: NEGATIVE
GC Probe RNA: NEGATIVE

## 2013-03-27 LAB — URINE CULTURE
Colony Count: NO GROWTH
Organism ID, Bacteria: NO GROWTH

## 2013-03-29 ENCOUNTER — Ambulatory Visit: Payer: Self-pay | Admitting: Obstetrics

## 2013-03-31 ENCOUNTER — Other Ambulatory Visit: Payer: Self-pay | Admitting: *Deleted

## 2013-03-31 DIAGNOSIS — B9689 Other specified bacterial agents as the cause of diseases classified elsewhere: Secondary | ICD-10-CM

## 2013-03-31 DIAGNOSIS — N760 Acute vaginitis: Secondary | ICD-10-CM

## 2013-03-31 MED ORDER — TINIDAZOLE 500 MG PO TABS: 1000.0000 mg | ORAL_TABLET | Freq: Every day | ORAL | Status: AC

## 2013-03-31 NOTE — Progress Notes (Signed)
Pt aware of results and prescription sent to pt's pharmacy. Pt expressed understanding.   

## 2013-10-18 ENCOUNTER — Encounter (HOSPITAL_COMMUNITY): Payer: Self-pay | Admitting: *Deleted

## 2013-10-18 ENCOUNTER — Inpatient Hospital Stay (HOSPITAL_COMMUNITY)
Admission: AD | Admit: 2013-10-18 | Discharge: 2013-10-19 | Disposition: A | Discharge status: Home / Self Care | Payer: PRIVATE HEALTH INSURANCE | Source: Ambulatory Visit | Attending: Obstetrics | Admitting: Obstetrics

## 2013-10-18 DIAGNOSIS — N939 Abnormal uterine and vaginal bleeding, unspecified: Secondary | ICD-10-CM

## 2013-10-18 DIAGNOSIS — N926 Irregular menstruation, unspecified: Secondary | ICD-10-CM

## 2013-10-18 DIAGNOSIS — N938 Other specified abnormal uterine and vaginal bleeding: Secondary | ICD-10-CM | POA: Insufficient documentation

## 2013-10-18 DIAGNOSIS — N949 Unspecified condition associated with female genital organs and menstrual cycle: Secondary | ICD-10-CM | POA: Insufficient documentation

## 2013-10-18 DIAGNOSIS — R109 Unspecified abdominal pain: Secondary | ICD-10-CM | POA: Insufficient documentation

## 2013-10-18 HISTORY — DX: Anemia, unspecified: D64.9

## 2013-10-18 LAB — URINALYSIS, ROUTINE W REFLEX MICROSCOPIC
Glucose, UA: NEGATIVE mg/dL
Ketones, ur: NEGATIVE mg/dL
Leukocytes, UA: NEGATIVE
Nitrite: NEGATIVE
Protein, ur: NEGATIVE mg/dL
Specific Gravity, Urine: 1.025 (ref 1.005–1.030)
Urobilinogen, UA: 0.2 mg/dL (ref 0.0–1.0)
pH: 6 (ref 5.0–8.0)

## 2013-10-18 LAB — WET PREP, GENITAL
Trich, Wet Prep: NONE SEEN
Yeast Wet Prep HPF POC: NONE SEEN

## 2013-10-18 LAB — CBC
HCT: 30.1 % — ABNORMAL LOW (ref 36.0–46.0)
Hemoglobin: 10.3 g/dL — ABNORMAL LOW (ref 12.0–15.0)
MCH: 22 pg — ABNORMAL LOW (ref 26.0–34.0)
MCHC: 34.2 g/dL (ref 30.0–36.0)
MCV: 64.3 fL — ABNORMAL LOW (ref 78.0–100.0)
Platelets: 312 10*3/uL (ref 150–400)
RBC: 4.68 MIL/uL (ref 3.87–5.11)
RDW: 14.4 % (ref 11.5–15.5)
WBC: 8 10*3/uL (ref 4.0–10.5)

## 2013-10-18 LAB — URINE MICROSCOPIC-ADD ON

## 2013-10-18 LAB — POCT PREGNANCY, URINE: Preg Test, Ur: NEGATIVE

## 2013-10-18 MED ORDER — KETOROLAC TROMETHAMINE 30 MG/ML IJ SOLN
30.0000 mg | Freq: Once | INTRAMUSCULAR | Status: AC
  Administered 2013-10-18: 30 mg via INTRAMUSCULAR
  Filled 2013-10-18: qty 1

## 2013-10-18 MED ORDER — MEDROXYPROGESTERONE ACETATE 10 MG PO TABS: 10.0000 mg | ORAL_TABLET | Freq: Every day | ORAL | Status: DC

## 2013-10-18 NOTE — MAU Note (Signed)
Pt reports reports she has had bleeding x 3 this months. States the first time was normal for her then 10days later she had bleeding for 4 days and 10 days later (today) she has started spotting again. Cramping since yesterday.

## 2013-10-18 NOTE — MAU Provider Note (Signed)
CC: Vaginal Bleeding and Abdominal Pain    First Provider Initiated Contact with Patient 10/18/13 2303      HPI Erika [name] is a 32 y.o. [id number]  who presents with onset of severe lower abdominal cramping and abnormal bleeding. She describes normal menses 09/23/13, then bleeding for 4 days 10 days after menses stopped. Today began spotting again. Not postcoital. No irritative vaginal discharge. Denies orthostatic symptoms but has history of anemia and takes PNV with iron.  Had irregular cycles in the past describing intervals of 2-6 wks and was on Sprintec for cycle regulation until she stopped taking it 3 months ago.   Past Medical History  Diagnosis Date  . Hypertension   . Anemia   . Abnormal Pap smear   . Fibroid   [place] 12/30/12: 1 cm fundal fibroid  OB History  Gravida Para Term Preterm AB SAB TAB Ectopic Multiple Living  3 2 2  1 1    2     # Outcome Date GA Lbr Len/2nd Weight Sex Delivery Anes PTL Lv  3 SAB           2 TRM           1 TRM               Past Surgical History  Procedure Laterality Date  . Tubal ligation    . Iud removal    . Leep      History   Social History  . Marital Status: Divorced    Spouse Name: N/A    Number of Children: N/A  . Years of Education: N/A   Occupational History  . Not on file.   Social History Main Topics  . Smoking status: Former Games developer  . Smokeless tobacco: Former Neurosurgeon    Quit date: 04/19/2011  . Alcohol Use: Yes     Comment: occasionally  . Drug Use: Yes    Special: Marijuana     Comment: last use 1 week ago  . Sexual Activity: Yes    Birth Control/ Protection: None, Surgical   Other Topics Concern  . Not on file   Social History Narrative  . No narrative on file    No current facility-administered medications on file prior to encounter.   Current Outpatient Prescriptions on File Prior to Encounter  Medication Sig Dispense Refill  . hydrocodone-ibuprofen (VICOPROFEN) 5-200 MG per tablet Take 1 tablet by  mouth every 6 (six) hours as needed for pain.  40 tablet  4  . HYDROcodone-ibuprofen (VICOPROFEN) 7.5-200 MG per tablet Take 1 tablet by mouth every 8 (eight) hours as needed for pain.      Marland Kitchen ibuprofen (ADVIL,MOTRIN) 600 MG tablet Take 1 tablet (600 mg total) by mouth every 6 (six) hours as needed for pain.  30 tablet  prn  . ibuprofen (ADVIL,MOTRIN) 800 MG tablet Take 800 mg by mouth every 8 (eight) hours as needed. pain      . nitrofurantoin, macrocrystal-monohydrate, (MACROBID) 100 MG capsule Take 1 capsule (100 mg total) by mouth 2 (two) times daily.  14 capsule  0  . norgestimate-ethinyl estradiol (ORTHO-CYCLEN,SPRINTEC,PREVIFEM) 0.25-35 MG-MCG tablet Take 1 tablet by mouth daily.  1 Package  11  . Prenatal Vit-Fe Fumarate-FA (PRENATAL MULTIVITAMIN) TABS Take 1 tablet by mouth daily.      . Prenatal Vit-Iron Carbonyl-FA (PRENATAL PLUS IRON) 29-1 MG TABS Take 1 tablet by mouth daily before breakfast.  30 tablet  11    No Known Allergies  ROS Pertinent items in HPI  PHYSICAL EXAM Filed Vitals:   10/18/13 2241  BP: 109/61  Pulse: 84  Temp: 99 F (37.2 C)  Resp: 20   General: Well nourished, well developed female in no acute distress but looks uncomfortable Cardiovascular: Normal rate Respiratory: Normal effort Abdomen: Soft, nontender Back: No CVAT Extremities: No edema Neurologic: Alert and oriented Speculum exam: NEFG; vagina with physiologic discharge, no blood noted ; cervix scarred appearance.5cm  surrounding ext os (had LEEP).  Bimanual exam: cervix closed, no CMT; uterus ULNS; no adnexal tenderness or masses   LAB RESULTS Results for orders placed during the hospital encounter of 10/18/13 (from the past 24 hour(s))  POCT PREGNANCY, URINE     Status: None   Collection Time    10/18/13 10:53 PM      Result Value Range   Preg Test, Ur NEGATIVE  NEGATIVE    IMAGING No results found.  MAU COURSE Toradol 30 mg IM given> good relief  ASSESSMENT  1. Abnormal  uterine bleeding (AUB)     PLAN C/W Dr. Gaynell Face: Provera 10mg  po x 10 d Discharge home. See AVS for patient education.    Medication List    STOP taking these medications       nitrofurantoin (macrocrystal-monohydrate) 100 MG capsule  Commonly known as:  MACROBID      TAKE these medications       hydrocodone-ibuprofen 5-200 MG per tablet  Commonly known as:  VICOPROFEN  Take 1 tablet by mouth every 6 (six) hours as needed for pain.     HYDROcodone-ibuprofen 7.5-200 MG per tablet  Commonly known as:  VICOPROFEN  Take 1 tablet by mouth every 8 (eight) hours as needed for pain.     ibuprofen 600 MG tablet  Commonly known as:  ADVIL,MOTRIN  Take 1 tablet (600 mg total) by mouth every 6 (six) hours as needed for pain.     ibuprofen 800 MG tablet  Commonly known as:  ADVIL,MOTRIN  Take 800 mg by mouth every 8 (eight) hours as needed. pain     medroxyPROGESTERone 10 MG tablet  Commonly known as:  PROVERA  Take 1 tablet (10 mg total) by mouth daily.     norgestimate-ethinyl estradiol 0.25-35 MG-MCG tablet  Commonly known as:  ORTHO-CYCLEN,SPRINTEC,PREVIFEM  Take 1 tablet by mouth daily.     prenatal multivitamin Tabs tablet  Take 1 tablet by mouth daily.     PRENATAL PLUS IRON 29-1 MG Tabs  Take 1 tablet by mouth daily before breakfast.       Follow-up Information   Follow up with Brock Bad, MD.   Specialty:  Obstetrics and Gynecology   Contact information:   [address] [place] [address] [phone]       Follow up with HARPER,CHARLES A, MD In 1 month.   Specialty:  Obstetrics and Gynecology   Contact information:   [address] [place] [address] [phone]         Danae Orleans, CNM 10/18/2013 11:09 PM

## 2013-10-19 LAB — GC/CHLAMYDIA PROBE AMP
CT Probe RNA: NEGATIVE
GC Probe RNA: NEGATIVE

## 2014-05-21 ENCOUNTER — Encounter (HOSPITAL_COMMUNITY): Payer: Self-pay | Admitting: *Deleted

## 2014-05-21 ENCOUNTER — Inpatient Hospital Stay (HOSPITAL_COMMUNITY)
Admission: AD | Admit: 2014-05-21 | Discharge: 2014-05-21 | Disposition: A | Discharge status: Home / Self Care | Payer: 59 | Source: Ambulatory Visit | Attending: Obstetrics | Admitting: Obstetrics

## 2014-05-21 DIAGNOSIS — N760 Acute vaginitis: Secondary | ICD-10-CM | POA: Diagnosis not present

## 2014-05-21 DIAGNOSIS — A499 Bacterial infection, unspecified: Secondary | ICD-10-CM | POA: Diagnosis not present

## 2014-05-21 DIAGNOSIS — R109 Unspecified abdominal pain: Secondary | ICD-10-CM | POA: Insufficient documentation

## 2014-05-21 DIAGNOSIS — B9689 Other specified bacterial agents as the cause of diseases classified elsewhere: Secondary | ICD-10-CM | POA: Diagnosis not present

## 2014-05-21 DIAGNOSIS — Z87891 Personal history of nicotine dependence: Secondary | ICD-10-CM | POA: Insufficient documentation

## 2014-05-21 DIAGNOSIS — L293 Anogenital pruritus, unspecified: Secondary | ICD-10-CM | POA: Insufficient documentation

## 2014-05-21 LAB — URINALYSIS, ROUTINE W REFLEX MICROSCOPIC
Bilirubin Urine: NEGATIVE
Glucose, UA: NEGATIVE mg/dL
Ketones, ur: NEGATIVE mg/dL
Leukocytes, UA: NEGATIVE
Nitrite: NEGATIVE
Protein, ur: NEGATIVE mg/dL
Specific Gravity, Urine: 1.015 (ref 1.005–1.030)
Urobilinogen, UA: 0.2 mg/dL (ref 0.0–1.0)
pH: 7 (ref 5.0–8.0)

## 2014-05-21 LAB — POCT PREGNANCY, URINE: Preg Test, Ur: NEGATIVE

## 2014-05-21 LAB — URINE MICROSCOPIC-ADD ON

## 2014-05-21 LAB — WET PREP, GENITAL
Trich, Wet Prep: NONE SEEN
Yeast Wet Prep HPF POC: NONE SEEN

## 2014-05-21 MED ORDER — KETOROLAC TROMETHAMINE 30 MG/ML IJ SOLN
30.0000 mg | Freq: Once | INTRAMUSCULAR | Status: AC
  Administered 2014-05-21: 30 mg via INTRAVENOUS
  Filled 2014-05-21: qty 1

## 2014-05-21 MED ORDER — METRONIDAZOLE 500 MG PO TABS: 500.0000 mg | ORAL_TABLET | Freq: Two times a day (BID) | ORAL | Status: DC

## 2014-05-21 NOTE — MAU Provider Note (Signed)
History     CSN: [id number]  Arrival date and time: 05/21/14 1635   First Provider Initiated Contact with Patient 05/21/14 1754      Chief Complaint  Patient presents with  . Vaginal Discharge    x1 day  . Abdominal Cramping    x1 day  . Dysuria    x1 day   HPI Erika [name] 33 y.o. Comes to MAU with green discharge and internal vaginal itching since this morning.  Also having lower abdominal pain and right ovary pain.  LMP 05-09-14.  Has had a BTL.    OB History   Grav Para Term Preterm Abortions TAB SAB Ect Mult Living   3 2 2  1  1   2       Past Medical History  Diagnosis Date  . Hypertension   . Anemia   . Abnormal Pap smear   . Fibroid     Past Surgical History  Procedure Laterality Date  . Tubal ligation    . Iud removal    . Leep      Family History  Problem Relation Age of Onset  . Cancer Mother   . Diabetes Father     History  Substance Use Topics  . Smoking status: Former Research scientist (life sciences)  . Smokeless tobacco: Former Systems developer    Quit date: 04/19/2011  . Alcohol Use: Yes     Comment: occasionally    Allergies: No Known Allergies  Prescriptions prior to admission  Medication Sig Dispense Refill  . Aspirin-Acetaminophen 500-325 MG PACK Take 1 packet by mouth daily as needed (headache).      Marland Kitchen ibuprofen (ADVIL,MOTRIN) 200 MG tablet Take 600 mg by mouth every 6 (six) hours as needed for moderate pain.        Review of Systems  Constitutional: Negative for fever.  Gastrointestinal: Positive for abdominal pain. Negative for nausea, vomiting, diarrhea and constipation.  Genitourinary:       Vaginal discharge. Vaginal itching. No vaginal bleeding. No dysuria.   Physical Exam   Blood pressure 134/84, pulse 61, temperature 98.1 F (36.7 C), temperature source Oral, resp. rate 18, height 5\' 3"  (1.6 m), weight 133 lb (60.328 kg), last menstrual period 05/09/2014.  Physical Exam  Nursing note and vitals reviewed. Constitutional: She is oriented to  person, place, and time. She appears well-developed and well-nourished.  HENT:  Head: Normocephalic.  Eyes: EOM are normal.  Neck: Neck supple.  Respiratory: Effort normal.  GI: Soft. There is tenderness. There is no rebound and no guarding.  Pain primarily in lower midline but diffuse pain all across the lower abdomen.  Genitourinary:  Speculum exam: Vagina - Small amount of yellow discharge, slightly frothy, no odor Cervix - No contact bleeding, strawberry appearance from 9-12 oclock Bimanual exam: Cervix closed Uterus tender, normal size Adnexa tender bilaterally, no masses  GC/Chlam, wet prep done Chaperone present for exam.  Musculoskeletal: Normal range of motion.  Neurological: She is alert and oriented to person, place, and time.  Skin: Skin is warm and dry.  Psychiatric: She has a normal mood and affect.    MAU Course  Procedures  MDM Results for orders placed during the hospital encounter of 05/21/14 (from the past 24 hour(s))  URINALYSIS, ROUTINE W REFLEX MICROSCOPIC     Status: Abnormal   Collection Time    05/21/14  5:20 PM      Result Value Ref Range   Color, Urine YELLOW  YELLOW   APPearance CLEAR  CLEAR   Specific Gravity, Urine 1.015  1.005 - 1.030   pH 7.0  5.0 - 8.0   Glucose, UA NEGATIVE  NEGATIVE mg/dL   Hgb urine dipstick TRACE (*) NEGATIVE   Bilirubin Urine NEGATIVE  NEGATIVE   Ketones, ur NEGATIVE  NEGATIVE mg/dL   Protein, ur NEGATIVE  NEGATIVE mg/dL   Urobilinogen, UA 0.2  0.0 - 1.0 mg/dL   Nitrite NEGATIVE  NEGATIVE   Leukocytes, UA NEGATIVE  NEGATIVE  URINE MICROSCOPIC-ADD ON     Status: Abnormal   Collection Time    05/21/14  5:20 PM      Result Value Ref Range   Squamous Epithelial / LPF FEW (*) RARE   WBC, UA 0-2  <3 WBC/hpf   RBC / HPF 0-2  <3 RBC/hpf   Bacteria, UA RARE  RARE   Urine-Other MUCOUS PRESENT    POCT PREGNANCY, URINE     Status: None   Collection Time    05/21/14  5:25 PM      Result Value Ref Range   Preg Test, Ur  NEGATIVE  NEGATIVE  WET PREP, GENITAL     Status: Abnormal   Collection Time    05/21/14  6:08 PM      Result Value Ref Range   Yeast Wet Prep HPF POC NONE SEEN  NONE SEEN   Trich, Wet Prep NONE SEEN  NONE SEEN   Clue Cells Wet Prep HPF POC MODERATE (*) NONE SEEN   WBC, Wet Prep HPF POC MODERATE (*) NONE SEEN   Given Toradol 30 mg IM for pain and her pain was reduced from 6/10 to 4/10 while in MAU.  Assessment and Plan  Bacterial vaginosis  Plan rx metronidazole PO for 7 days sent to client's pharmacy Pick up your medication tonight and take as directed. No sex for 7 days No alcohol while taking your medication. Cultures pending. Make an appointment with your doctor if your symptoms continue.  Sahaj Bona 05/21/2014, 6:09 PM

## 2014-05-21 NOTE — Discharge Instructions (Signed)
Pick up your medication tonight and take as directed. No sex for 7 days No alcohol while taking your medication. Cultures pending. Make an appointment with your doctor if your symptoms continue.

## 2014-05-21 NOTE — MAU Note (Signed)
Pt states that she had a green vaginal discharge this morning with an odor, right ovary pain and cramping, burning and itching upon urination. Pt also c/o cramping after sex a few days ago. LMP was June 15, is not currently on birth control.

## 2014-05-23 LAB — GC/CHLAMYDIA PROBE AMP
CT Probe RNA: NEGATIVE
GC Probe RNA: NEGATIVE

## 2014-05-31 ENCOUNTER — Encounter: Payer: Self-pay | Admitting: Obstetrics

## 2014-05-31 ENCOUNTER — Ambulatory Visit (INDEPENDENT_AMBULATORY_CARE_PROVIDER_SITE_OTHER): Payer: 59 | Admitting: Obstetrics

## 2014-05-31 VITALS — BP 118/77 | HR 74 | Temp 98.8°F | Ht 63.0 in | Wt 136.0 lb

## 2014-05-31 DIAGNOSIS — Z01419 Encounter for gynecological examination (general) (routine) without abnormal findings: Secondary | ICD-10-CM

## 2014-05-31 DIAGNOSIS — Z113 Encounter for screening for infections with a predominantly sexual mode of transmission: Secondary | ICD-10-CM

## 2014-05-31 DIAGNOSIS — N939 Abnormal uterine and vaginal bleeding, unspecified: Secondary | ICD-10-CM

## 2014-05-31 DIAGNOSIS — N926 Irregular menstruation, unspecified: Secondary | ICD-10-CM | POA: Insufficient documentation

## 2014-05-31 DIAGNOSIS — N946 Dysmenorrhea, unspecified: Secondary | ICD-10-CM

## 2014-05-31 MED ORDER — IBUPROFEN 800 MG PO TABS: 800.0000 mg | ORAL_TABLET | Freq: Three times a day (TID) | ORAL | Status: DC | PRN

## 2014-05-31 NOTE — Progress Notes (Signed)
Subjective:     Erika [name] is a 33 y.o. female here for a routine exam.  Current complaints:  None.  Cycles are 7 days and light the last 3-4 days.  Personal health questionnaire:  Is patient [name], have a family history of breast and/or ovarian cancer: yes, mother with Breast CA. Is there a family history of uterine cancer diagnosed at age < 58, gastrointestinal cancer, urinary tract cancer, family member who is a Field seismologist syndrome-associated carrier: no Is the patient overweight and hypertensive, family history of diabetes, personal history of gestational diabetes or PCOS: no Is patient over 44, have PCOS,  family history of premature CHD under age 58, diabetes, smoke, have hypertension or peripheral artery disease:  yes  The HPI was reviewed and explored in further detail by the provider. Gynecologic History Patient's last menstrual period was 05/09/2014. Contraception: tubal ligation Last Pap: May 2014. Results were: normal Last mammogram: n/a`. Results were: n/a  Obstetric History OB History  Gravida Para Term Preterm AB SAB TAB Ectopic Multiple Living  3 2 2  1 1    2     # Outcome Date GA Lbr Len/2nd Weight Sex Delivery Anes PTL Lv  3 SAB           2 TRM      SVD     1 TRM      SVD         Past Medical History  Diagnosis Date  . Hypertension   . Anemia   . Abnormal Pap smear   . Fibroid     Past Surgical History  Procedure Laterality Date  . Tubal ligation    . Iud removal    . Leep      Current outpatient prescriptions:Aspirin-Acetaminophen 500-325 MG PACK, Take 1 packet by mouth daily as needed (headache)., Disp: , Rfl: ;  ibuprofen (ADVIL,MOTRIN) 800 MG tablet, Take 1 tablet (800 mg total) by mouth every 8 (eight) hours as needed for moderate pain or cramping., Disp: 30 tablet, Rfl: prn No Known Allergies  History  Substance Use Topics  . Smoking status: Former Research scientist (life sciences)  . Smokeless tobacco: Former Systems developer    Quit date: 04/19/2011  . Alcohol Use: Yes      Comment: occasionally    Family History  Problem Relation Age of Onset  . Cancer Mother   . Diabetes Father       Review of Systems  Constitutional: negative for fatigue and weight loss Respiratory: negative for cough and wheezing Cardiovascular: negative for chest pain, fatigue and palpitations Gastrointestinal: negative for abdominal pain and change in bowel habits Musculoskeletal:negative for myalgias Neurological: negative for gait problems and tremors Behavioral/Psych: negative for abusive relationship, depression Endocrine: negative for temperature intolerance   Genitourinary:  Positive for heavy and painful periods.  Recently treated a week ago for BV. Integument/breast: negative for breast lump, breast tenderness, nipple discharge and skin lesion(s)    Objective:       BP 118/77  Pulse 74  Temp(Src) 98.8 F (37.1 C)  Ht 5\' 3"  (1.6 m)  Wt 136 lb (61.689 kg)  BMI 24.10 kg/m2  LMP 05/09/2014 General:   alert  Skin:   no rash or abnormalities  Lungs:   clear to auscultation bilaterally  Heart:   regular rate and rhythm, S1, S2 normal, no murmur, click, rub or gallop  Breasts:   normal without suspicious masses, skin or nipple changes or axillary nodes  Abdomen:  normal findings: no organomegaly,  soft, non-tender and no hernia  Pelvis:  External genitalia: normal general appearance Urinary system: urethral meatus normal and bladder without fullness, nontender Vaginal: normal without tenderness, induration or masses Cervix: normal appearance Adnexa: normal bimanual exam Uterus: anteverted and non-tender, normal size   Lab Review Urine pregnancy test Labs reviewed yes Radiologic studies reviewed no    Assessment:    Healthy female exam.   H/O AUB with heavy painful periods.  Better now with OCP's and NSAIDS. Plan:    Education reviewed: calcium supplements, safe sex/STD prevention and self breast exams. Contraception: tubal ligation. Follow up in: 1  year. Sprintec 28 and Ibuprofen Rx.   Meds ordered this encounter  Medications  . ibuprofen (ADVIL,MOTRIN) 800 MG tablet    Sig: Take 1 tablet (800 mg total) by mouth every 8 (eight) hours as needed for moderate pain or cramping.    Dispense:  30 tablet    Refill:  prn   Orders Placed This Encounter  Procedures  . HIV antibody  . Hepatitis B surface antigen  . Hepatitis C antibody  . RPR    Possible management options include:  Continuation of OCP's and NSAIDS for AUB and Dysmenorrhea.

## 2014-06-01 ENCOUNTER — Other Ambulatory Visit: Payer: Self-pay | Admitting: *Deleted

## 2014-06-01 DIAGNOSIS — Z3041 Encounter for surveillance of contraceptive pills: Secondary | ICD-10-CM

## 2014-06-01 DIAGNOSIS — N946 Dysmenorrhea, unspecified: Secondary | ICD-10-CM

## 2014-06-01 LAB — HEPATITIS B SURFACE ANTIGEN: Hepatitis B Surface Ag: NEGATIVE

## 2014-06-01 LAB — PAP IG AND HPV HIGH-RISK: HPV DNA High Risk: DETECTED — AB

## 2014-06-01 LAB — HIV ANTIBODY (ROUTINE TESTING W REFLEX): HIV 1&2 Ab, 4th Generation: NONREACTIVE

## 2014-06-01 LAB — RPR

## 2014-06-01 LAB — HEPATITIS C ANTIBODY: HCV Ab: NEGATIVE

## 2014-06-01 MED ORDER — NORGESTIMATE-ETH ESTRADIOL 0.25-35 MG-MCG PO TABS: 1.0000 | ORAL_TABLET | Freq: Every day | ORAL | Status: DC

## 2014-06-01 MED ORDER — PRENATAL PLUS IRON 29-1 MG PO TABS: 1.0000 | ORAL_TABLET | Freq: Every day | ORAL | Status: DC

## 2014-07-14 ENCOUNTER — Other Ambulatory Visit: Payer: Self-pay | Admitting: Obstetrics

## 2014-07-14 ENCOUNTER — Encounter: Payer: Self-pay | Admitting: Obstetrics

## 2014-07-14 ENCOUNTER — Ambulatory Visit (INDEPENDENT_AMBULATORY_CARE_PROVIDER_SITE_OTHER): Payer: 59 | Admitting: Obstetrics

## 2014-07-14 VITALS — BP 120/90 | Temp 98.3°F | Ht 63.0 in | Wt 135.0 lb

## 2014-07-14 DIAGNOSIS — N946 Dysmenorrhea, unspecified: Secondary | ICD-10-CM

## 2014-07-14 DIAGNOSIS — B9689 Other specified bacterial agents as the cause of diseases classified elsewhere: Secondary | ICD-10-CM

## 2014-07-14 DIAGNOSIS — N760 Acute vaginitis: Secondary | ICD-10-CM

## 2014-07-14 DIAGNOSIS — Z01812 Encounter for preprocedural laboratory examination: Secondary | ICD-10-CM

## 2014-07-14 DIAGNOSIS — R87612 Low grade squamous intraepithelial lesion on cytologic smear of cervix (LGSIL): Secondary | ICD-10-CM

## 2014-07-14 MED ORDER — IBUPROFEN 800 MG PO TABS: 800.0000 mg | ORAL_TABLET | Freq: Three times a day (TID) | ORAL | Status: DC | PRN

## 2014-07-14 MED ORDER — HYDROCODONE-IBUPROFEN 7.5-200 MG PO TABS
1.0000 | ORAL_TABLET | Freq: Four times a day (QID) | ORAL | Status: DC | PRN
Start: 2014-07-14 — End: 2015-04-05

## 2014-07-14 NOTE — Addendum Note (Signed)
Addended by: Ladona Ridgel on: 07/14/2014 06:08 PM   Modules accepted: Orders

## 2014-07-15 LAB — WET PREP BY MOLECULAR PROBE
Candida species: NEGATIVE
Gardnerella vaginalis: POSITIVE — AB
Trichomonas vaginosis: NEGATIVE

## 2014-07-16 LAB — GC/CHLAMYDIA PROBE AMP
CT Probe RNA: NEGATIVE
GC Probe RNA: NEGATIVE

## 2014-07-16 MED ORDER — METRONIDAZOLE 500 MG PO TABS
500.0000 mg | ORAL_TABLET | Freq: Two times a day (BID) | ORAL | Status: DC
Start: 2014-07-16 — End: 2014-07-19

## 2014-07-16 NOTE — Addendum Note (Signed)
Addended by: Shelly Bombard on: 07/16/2014 06:41 AM   Modules accepted: Orders

## 2014-07-18 NOTE — Progress Notes (Addendum)
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 The prior pap showed no abnormalities.  Prior cervical/vaginal disease: HGSIL in 2009. Prior cervical treatment:  Cold Knife Conization  Procedure Details  The risks and benefits of the procedure and Written informed consent obtained.  A time-out was performed confirming the patient, procedure and allergy status  Speculum placed in vagina and excellent visualization of cervix achieved, cervix swabbed x 3 with acetic acid solution.  Findings: Cervix: no visible lesions; SCJ visualized 360 degrees without lesions, endocervical curettage performed, cervical biopsies taken at 6 and 12 o'clock, specimen labelled and sent to pathology and hemostasis achieved with silver nitrate.   Vaginal inspection: normal without visible lesions. Vulvar colposcopy: vulvar colposcopy not performed.   Physical Exam   Specimens: ECC and cervical biopsies.  Complications: none.  Plan: Specimens labelled and sent to Pathology. Will base further treatment on Pathology findings. Post biopsy instructions given to patient. Return to discuss Pathology results in 2 weeks.

## 2014-07-18 NOTE — Addendum Note (Signed)
Addended by: Baltazar Najjar A on: 07/18/2014 11:04 AM   Modules accepted: Level of Service

## 2014-07-19 ENCOUNTER — Other Ambulatory Visit: Payer: Self-pay | Admitting: Obstetrics

## 2014-07-19 DIAGNOSIS — B9689 Other specified bacterial agents as the cause of diseases classified elsewhere: Secondary | ICD-10-CM

## 2014-07-19 DIAGNOSIS — N760 Acute vaginitis: Principal | ICD-10-CM

## 2014-07-19 MED ORDER — METRONIDAZOLE 500 MG PO TABS: 500.0000 mg | ORAL_TABLET | Freq: Two times a day (BID) | ORAL | Status: DC

## 2014-07-28 ENCOUNTER — Ambulatory Visit: Payer: 59 | Admitting: Obstetrics

## 2014-08-04 ENCOUNTER — Telehealth: Payer: Self-pay | Admitting: Obstetrics

## 2014-08-29 NOTE — Telephone Encounter (Signed)
10.05.2015 - patient has a 6 month and annual appts scheduled. brm

## 2014-09-26 ENCOUNTER — Encounter: Payer: Self-pay | Admitting: Obstetrics

## 2014-09-27 ENCOUNTER — Telehealth: Payer: Self-pay | Admitting: *Deleted

## 2014-09-27 NOTE — Telephone Encounter (Signed)
Patient called requesting a refill on Vicoprofen and her ibuprofen 800 mg. Attempted to contact patient and left message for patient to contact the office.

## 2014-09-30 NOTE — Telephone Encounter (Signed)
Patient stopped by the office to check on her prescription refill. Patient states she has already received the prescription refill on the Ibuprofen. Patient advised awaiting a response from the provider regarding the Vicoprofen prescription. Patient advised there is a possibility that the doctor may deny the prescription. Patient advised that we usually don't refill narcotic prescriptions. Patient states she was okay and didn't need to have the prescription refilled.

## 2014-10-26 ENCOUNTER — Telehealth: Payer: Self-pay | Admitting: *Deleted

## 2014-10-26 DIAGNOSIS — Z3041 Encounter for surveillance of contraceptive pills: Secondary | ICD-10-CM

## 2014-10-26 MED ORDER — NORGESTIMATE-ETH ESTRADIOL 0.25-35 MG-MCG PO TABS: 1.0000 | ORAL_TABLET | Freq: Every day | ORAL | Status: DC

## 2014-10-26 NOTE — Telephone Encounter (Signed)
Patient states she was changed from [place] to [place] and has been bleeding for 11 days. Per Dr Jodi Mourning- may change back to [place]. May start today- with 2pills/day until she catches up. LM on VM per patient request.

## 2015-02-07 ENCOUNTER — Encounter: Payer: Self-pay | Admitting: Obstetrics

## 2015-02-07 ENCOUNTER — Other Ambulatory Visit: Payer: Self-pay

## 2015-02-07 ENCOUNTER — Ambulatory Visit (INDEPENDENT_AMBULATORY_CARE_PROVIDER_SITE_OTHER): Payer: 59 | Admitting: Obstetrics

## 2015-02-07 VITALS — BP 124/77 | HR 69 | Temp 98.7°F | Ht 63.0 in | Wt 128.0 lb

## 2015-02-07 DIAGNOSIS — R87612 Low grade squamous intraepithelial lesion on cytologic smear of cervix (LGSIL): Secondary | ICD-10-CM

## 2015-02-07 DIAGNOSIS — D509 Iron deficiency anemia, unspecified: Secondary | ICD-10-CM

## 2015-02-07 DIAGNOSIS — Z803 Family history of malignant neoplasm of breast: Secondary | ICD-10-CM | POA: Diagnosis not present

## 2015-02-07 DIAGNOSIS — Z01419 Encounter for gynecological examination (general) (routine) without abnormal findings: Secondary | ICD-10-CM

## 2015-02-07 DIAGNOSIS — K5900 Constipation, unspecified: Secondary | ICD-10-CM | POA: Diagnosis not present

## 2015-02-07 DIAGNOSIS — Z1239 Encounter for other screening for malignant neoplasm of breast: Secondary | ICD-10-CM

## 2015-02-07 DIAGNOSIS — N946 Dysmenorrhea, unspecified: Secondary | ICD-10-CM

## 2015-02-07 MED ORDER — IBUPROFEN 800 MG PO TABS: 800.0000 mg | ORAL_TABLET | Freq: Three times a day (TID) | ORAL | Status: DC | PRN

## 2015-02-07 MED ORDER — DOCUSATE SODIUM 100 MG PO CAPS: 100.0000 mg | ORAL_CAPSULE | Freq: Every evening | ORAL | Status: DC | PRN

## 2015-02-07 MED ORDER — PRENATAL PLUS 27-1 MG PO TABS: 1.0000 | ORAL_TABLET | Freq: Every day | ORAL | Status: DC

## 2015-02-07 NOTE — Progress Notes (Addendum)
Patient ID: Erika [name], female   DOB: 08-15-1981, 34 y.o.   MRN: [id number]  Chief Complaint  Patient presents with  . Follow-up    6 month check up/ colpo     HPI Erika [name] is a 34 y.o. female.  Presents for 6 month F/U after colposcopy.  Colposcopy revealed LGSIL.  Patient also concerned about her risk for Breast CA.  Mother and older sister both had Breast CA in their early to mid forties.  HPI  Past Medical History  Diagnosis Date  . Hypertension   . Anemia   . Abnormal Pap smear   . Fibroid     Past Surgical History  Procedure Laterality Date  . Tubal ligation    . Iud removal    . Leep      Family History  Problem Relation Age of Onset  . Cancer Mother   . Diabetes Father     Social History History  Substance Use Topics  . Smoking status: Former Research scientist (life sciences)  . Smokeless tobacco: Former Systems developer    Quit date: 04/19/2011  . Alcohol Use: Yes     Comment: occasionally    No Known Allergies  Current Outpatient Prescriptions  Medication Sig Dispense Refill  . Aspirin-Acetaminophen 500-325 MG PACK Take 1 packet by mouth daily as needed (headache).    Marland Kitchen ibuprofen (ADVIL,MOTRIN) 800 MG tablet Take 1 tablet (800 mg total) by mouth every 8 (eight) hours as needed for mild pain, moderate pain or cramping. 30 tablet 5  . norgestimate-ethinyl estradiol (ORTHO-CYCLEN,SPRINTEC,PREVIFEM) 0.25-35 MG-MCG tablet Take 1 tablet by mouth daily. 1 Package 11  . docusate sodium (COLACE) 100 MG capsule Take 1 capsule (100 mg total) by mouth at bedtime as needed for mild constipation. 30 capsule 11  . HYDROcodone-ibuprofen (VICOPROFEN) 7.5-200 MG per tablet Take 1 tablet by mouth every 6 (six) hours as needed for moderate pain. (Patient not taking: Reported on 02/07/2015) 40 tablet 0  . metroNIDAZOLE (FLAGYL) 500 MG tablet Take 1 tablet (500 mg total) by mouth 2 (two) times daily. (Patient not taking: Reported on 02/07/2015) 14 tablet 2  . Prenatal Vit-Iron Carbonyl-FA (PRENATAL PLUS  IRON) 29-1 MG TABS Take 1 tablet by mouth daily. (Patient not taking: Reported on 02/07/2015) 30 tablet prn  . prenatal vitamin w/FE, FA (PRENATAL 1 + 1) 27-1 MG TABS tablet Take 1 tablet by mouth daily at 12 noon. 30 each 0   No current facility-administered medications for this visit.    Review of Systems Review of Systems Constitutional: negative for fatigue and weight loss Respiratory: negative for cough and wheezing Cardiovascular: negative for chest pain, fatigue and palpitations Gastrointestinal: negative for abdominal pain and change in bowel habits Genitourinary: painful, heavy periods Integument/breast: negative for nipple discharge Musculoskeletal:negative for myalgias Neurological: negative for gait problems and tremors Behavioral/Psych: negative for abusive relationship, depression Endocrine: negative for temperature intolerance     Blood pressure 124/77, pulse 69, temperature 98.7 F (37.1 C), height 5\' 3"  (1.6 m), weight 128 lb (58.06 kg), last menstrual period 01/15/2015.  Physical Exam Physical Exam                Abdomen:  normal findings: no organomegaly, soft, non-tender and no hernia  Pelvis:  External genitalia: normal general appearance Urinary system: urethral meatus normal and bladder without fullness, nontender Vaginal: normal without tenderness, induration or masses Cervix: normal appearance.  Pap smear done.       Data Reviewed Labs  Assessment  LGSIL Family history of breast cancer in two 1st degree relatives  Dysmenorrhea    Plan    Pap smear done today.  Repeat pap in 6 months. [facility] due to family history Ibuprofen Rx  Orders Placed This Encounter  Procedures  . SureSwab, Vaginosis/Vaginitis Plus  . MM Digital Screening    EPIC ORDER PF: NONE  NO NEEDS CR/PT  COVENTRY  2D NO IMPLANTS/ NO PROBS/ HX BR CA IN MOTHER 47-SISTER 42    Standing Status: Future     Number of Occurrences:       Standing Expiration Date: 04/08/2016    Order Specific Question:  Reason for Exam (SYMPTOM  OR DIAGNOSIS REQUIRED)    Answer:  Family history of Breast CA.  Visit for screening mammogram.    Order Specific Question:  Is the patient pregnant?    Answer:  No    Order Specific Question:  Preferred imaging location?    Answer:  [facility]   Meds ordered this encounter  Medications  . ibuprofen (ADVIL,MOTRIN) 800 MG tablet    Sig: Take 1 tablet (800 mg total) by mouth every 8 (eight) hours as needed for mild pain, moderate pain or cramping.    Dispense:  30 tablet    Refill:  5  . prenatal vitamin w/FE, FA (PRENATAL 1 + 1) 27-1 MG TABS tablet    Sig: Take 1 tablet by mouth daily at 12 noon.    Dispense:  30 each    Refill:  0  . docusate sodium (COLACE) 100 MG capsule    Sig: Take 1 capsule (100 mg total) by mouth at bedtime as needed for mild constipation.    Dispense:  30 capsule    Refill:  11

## 2015-02-09 LAB — SURESWAB, VAGINOSIS/VAGINITIS PLUS
Atopobium vaginae: 7.5 Log (cells/mL)
C. albicans, DNA: NOT DETECTED
C. glabrata, DNA: NOT DETECTED
C. parapsilosis, DNA: NOT DETECTED
C. trachomatis RNA, TMA: NOT DETECTED
C. tropicalis, DNA: NOT DETECTED
Gardnerella vaginalis: >8 Log (cells/mL)
LACTOBACILLUS SPECIES: NOT DETECTED Log (cells/mL)
MEGASPHAERA SPECIES: >8 Log (cells/mL)
N. gonorrhoeae RNA, TMA: NOT DETECTED
T. vaginalis RNA, QL TMA: NOT DETECTED

## 2015-02-10 ENCOUNTER — Other Ambulatory Visit: Payer: Self-pay | Admitting: Obstetrics

## 2015-02-10 DIAGNOSIS — B9689 Other specified bacterial agents as the cause of diseases classified elsewhere: Secondary | ICD-10-CM

## 2015-02-10 DIAGNOSIS — N760 Acute vaginitis: Principal | ICD-10-CM

## 2015-02-10 LAB — PAP IG AND HPV HIGH-RISK: HPV DNA High Risk: DETECTED — AB

## 2015-02-10 MED ORDER — TINIDAZOLE 500 MG PO TABS: 1000.0000 mg | ORAL_TABLET | Freq: Every day | ORAL | Status: DC

## 2015-02-15 ENCOUNTER — Ambulatory Visit: Payer: PRIVATE HEALTH INSURANCE

## 2015-03-14 ENCOUNTER — Ambulatory Visit
Admission: RE | Admit: 2015-03-14 | Discharge: 2015-03-14 | Disposition: A | Discharge status: Home / Self Care | Payer: 59 | Source: Ambulatory Visit | Attending: Obstetrics | Admitting: Obstetrics

## 2015-03-14 DIAGNOSIS — Z1239 Encounter for other screening for malignant neoplasm of breast: Secondary | ICD-10-CM

## 2015-03-14 DIAGNOSIS — Z803 Family history of malignant neoplasm of breast: Secondary | ICD-10-CM

## 2015-04-05 ENCOUNTER — Encounter: Payer: Self-pay | Admitting: Certified Nurse Midwife

## 2015-04-05 ENCOUNTER — Ambulatory Visit (INDEPENDENT_AMBULATORY_CARE_PROVIDER_SITE_OTHER): Payer: 59 | Admitting: Certified Nurse Midwife

## 2015-04-05 VITALS — BP 137/91 | HR 74 | Temp 98.1°F | Ht 63.0 in | Wt 122.0 lb

## 2015-04-05 DIAGNOSIS — N939 Abnormal uterine and vaginal bleeding, unspecified: Secondary | ICD-10-CM | POA: Diagnosis not present

## 2015-04-05 MED ORDER — MEDROXYPROGESTERONE ACETATE 5 MG PO TABS: 5.0000 mg | ORAL_TABLET | Freq: Every day | ORAL | Status: DC

## 2015-04-05 MED ORDER — ETONOGESTREL-ETHINYL ESTRADIOL 0.12-0.015 MG/24HR VA RING: 1.0000 | VAGINAL_RING | VAGINAL | Status: DC

## 2015-04-05 NOTE — Progress Notes (Signed)
Patient ID: Erika [name], female   DOB: 09/19/1981, 34 y.o.   MRN: [id number]   Chief Complaint  Patient presents with  . Menstrual Problem    Cycles lasting 10 days.     HPI Erika [name] is a 34 y.o. female.  C/O cycle that started a week early and has lasted 10+days with moderate bleeding.  Desires for the bleeding to stop.  Discussed various birth control options.  She decided to try the [employer].  Patient educated on its use.  Patient stated that she does not have a hx or family hx of DVTs.     HPI  Past Medical History  Diagnosis Date  . Hypertension   . Anemia   . Abnormal Pap smear   . Fibroid     Past Surgical History  Procedure Laterality Date  . Tubal ligation    . Iud removal    . Leep      Family History  Problem Relation Age of Onset  . Cancer Mother   . Diabetes Father     Social History History  Substance Use Topics  . Smoking status: Current Every Day Smoker  . Smokeless tobacco: Never Used  . Alcohol Use: 0.0 oz/week    0 Standard drinks or equivalent per week     Comment: occasionally    No Known Allergies  Current Outpatient Prescriptions  Medication Sig Dispense Refill  . ibuprofen (ADVIL,MOTRIN) 800 MG tablet Take 1 tablet (800 mg total) by mouth every 8 (eight) hours as needed for mild pain, moderate pain or cramping. 30 tablet 5  . norgestimate-ethinyl estradiol (ORTHO-CYCLEN,SPRINTEC,PREVIFEM) 0.25-35 MG-MCG tablet Take 1 tablet by mouth daily. 1 Package 11  . etonogestrel-ethinyl estradiol (NUVARING) 0.12-0.015 MG/24HR vaginal ring Place 1 each vaginally every 21 ( twenty-one) days. Insert one (1) ring vaginally and leave in place for 25 days, then replace. 1 each 11  . medroxyPROGESTERone (PROVERA) 5 MG tablet Take 1 tablet (5 mg total) by mouth daily. 10 tablet 0   No current facility-administered medications for this visit.    Review of Systems Review of Systems Constitutional: negative for fatigue and weight  loss Respiratory: negative for cough and wheezing Cardiovascular: negative for chest pain, fatigue and palpitations Gastrointestinal: negative for abdominal pain and change in bowel habits Genitourinary: +AUB Integument/breast: negative for nipple discharge Musculoskeletal:negative for myalgias Neurological: negative for gait problems and tremors Behavioral/Psych: negative for abusive relationship, depression Endocrine: negative for temperature intolerance     Blood pressure 137/91, pulse 74, temperature 98.1 F (36.7 C), height 5\' 3"  (1.6 m), weight 55.339 kg (122 lb), last menstrual period 03/27/2015.  Physical Exam Physical Exam General:   alert  Skin:   no rash or abnormalities  Lungs:   clear to auscultation bilaterally  Heart:   regular rate and rhythm, S1, S2 normal, no murmur, click, rub or gallop  Breasts:   deferred  Abdomen:  normal findings: no organomegaly, soft, non-tender and no hernia  Pelvis:  External genitalia: normal general appearance Urinary system: urethral meatus normal and bladder without fullness, nontender Vaginal: normal without tenderness, induration or masses, +menses Cervix: deferred Adnexa: normal bimanual exam, small right ovarian cyst Uterus: anteverted and non-tender, slightly enlarged in size    50% of 15 min visit spent on counseling and coordination of care.   Data Reviewed Previous medical hx, labs, meds  Assessment     Contraception counseling AUB     Plan    Orders Placed This Encounter  Procedures  . US Transvaginal Non-OB    Standing Status: Future     Number of Occurrences:      Standing Expiration Date: 06/04/2016    Order Specific Question:  Reason for Exam (SYMPTOM  OR DIAGNOSIS REQUIRED)    Answer:  AUB    Order Specific Question:  Preferred imaging location?    Answer:  [facility]  . POCT urine pregnancy   Meds ordered this encounter  Medications  . etonogestrel-ethinyl estradiol (NUVARING) 0.12-0.015 MG/24HR  vaginal ring    Sig: Place 1 each vaginally every 21 ( twenty-one) days. Insert one (1) ring vaginally and leave in place for 25 days, then replace.    Dispense:  1 each    Refill:  11  . medroxyPROGESTERone (PROVERA) 5 MG tablet    Sig: Take 1 tablet (5 mg total) by mouth daily.    Dispense:  10 tablet    Refill:  0     Possible management options include: [facility] Follow up in 3-4 months.

## 2015-04-05 NOTE — Addendum Note (Signed)
Addended by: Carole Binning on: 04/05/2015 05:13 PM   Modules accepted: Orders

## 2015-04-08 LAB — SURESWAB, VAGINOSIS/VAGINITIS PLUS
Atopobium vaginae: 7.3 Log (cells/mL)
C. albicans, DNA: NOT DETECTED
C. glabrata, DNA: NOT DETECTED
C. parapsilosis, DNA: NOT DETECTED
C. trachomatis RNA, TMA: NOT DETECTED
C. tropicalis, DNA: NOT DETECTED
Gardnerella vaginalis: 8 Log (cells/mL)
LACTOBACILLUS SPECIES: NOT DETECTED Log (cells/mL)
MEGASPHAERA SPECIES: 8 Log (cells/mL)
N. gonorrhoeae RNA, TMA: NOT DETECTED
T. vaginalis RNA, QL TMA: NOT DETECTED

## 2015-04-11 ENCOUNTER — Other Ambulatory Visit: Payer: Self-pay | Admitting: Certified Nurse Midwife

## 2015-04-13 ENCOUNTER — Telehealth: Payer: Self-pay | Admitting: *Deleted

## 2015-04-13 NOTE — Telephone Encounter (Signed)
Patient request call back 3:26 Patient state she has made an appointment- she reports prolonged bleeding and wants to discuss ablation. Also is concerned about her iron levels.Told patient to keep her appointment.

## 2015-04-14 ENCOUNTER — Ambulatory Visit (INDEPENDENT_AMBULATORY_CARE_PROVIDER_SITE_OTHER): Payer: PRIVATE HEALTH INSURANCE | Admitting: Obstetrics

## 2015-04-14 ENCOUNTER — Encounter: Payer: Self-pay | Admitting: Obstetrics

## 2015-04-14 VITALS — BP 136/85 | HR 78 | Wt 125.0 lb

## 2015-04-14 DIAGNOSIS — N939 Abnormal uterine and vaginal bleeding, unspecified: Secondary | ICD-10-CM | POA: Diagnosis not present

## 2015-04-14 DIAGNOSIS — A499 Bacterial infection, unspecified: Secondary | ICD-10-CM

## 2015-04-14 DIAGNOSIS — B9689 Other specified bacterial agents as the cause of diseases classified elsewhere: Secondary | ICD-10-CM

## 2015-04-14 DIAGNOSIS — N760 Acute vaginitis: Secondary | ICD-10-CM

## 2015-04-14 MED ORDER — METRONIDAZOLE 500 MG PO TABS: 500.0000 mg | ORAL_TABLET | Freq: Two times a day (BID) | ORAL | Status: DC

## 2015-04-14 MED ORDER — MEDROXYPROGESTERONE ACETATE 10 MG PO TABS: 10.0000 mg | ORAL_TABLET | Freq: Every day | ORAL | Status: DC

## 2015-04-14 NOTE — Progress Notes (Signed)
Erika [name] is a 34 y.o.who presents for irregular menses. Patient's last menstrual period was 02/28/2014. Periods are irregular, lasting 5 days. Dysmenorrhea:moderate, occurring throughout cycle. Cyclic symptoms include: pelvic pain. Current contraception: BTL.History of infertility: no. History of abnormal Pap smear: yes - normal.  Patient Active Problem List   Diagnosis Date Noted  . Unspecified disorder of menstruation and other abnormal bleeding from female genital tract 05/31/2014  . UTI (urinary tract infection) 03/25/2013  . Surveillance of previously prescribed contraceptive pill 03/25/2013  . Dysmenorrhea 03/25/2013   Past Medical History  Diagnosis Date  . Hypertension   . Anemia   . Abnormal Pap smear   . Fibroid     Past Surgical History  Procedure Laterality Date  . Tubal ligation    . Iud removal    . Leep       Current outpatient prescriptions:  .  etonogestrel-ethinyl estradiol (NUVARING) 0.12-0.015 MG/24HR vaginal ring, Place 1 each vaginally every 21 ( twenty-one) days. Insert one (1) ring vaginally and leave in place for 25 days, then replace., Disp: 1 each, Rfl: 11 .  ibuprofen (ADVIL,MOTRIN) 800 MG tablet, Take 1 tablet (800 mg total) by mouth every 8 (eight) hours as needed for mild pain, moderate pain or cramping., Disp: 30 tablet, Rfl: 5 .  medroxyPROGESTERone (PROVERA) 10 MG tablet, Take 1 tablet (10 mg total) by mouth daily., Disp: 30 tablet, Rfl: 0 .  metroNIDAZOLE (FLAGYL) 500 MG tablet, Take 1 tablet (500 mg total) by mouth 2 (two) times daily., Disp: 14 tablet, Rfl: 2 .  norgestimate-ethinyl estradiol (ORTHO-CYCLEN,SPRINTEC,PREVIFEM) 0.25-35 MG-MCG tablet, Take 1 tablet by mouth daily. (Patient not taking: Reported on 04/14/2015), Disp: 1 Package, Rfl: 11 No Known Allergies  History  Substance Use Topics  . Smoking status: Current Every Day Smoker  . Smokeless tobacco: Never Used  . Alcohol Use: 0.0 oz/week    0 Standard drinks or equivalent per  week     Comment: occasionally    Family History  Problem Relation Age of Onset  . Cancer Mother   . Diabetes Father      Review of Systems Constitutional: negative for fatigue and weight loss Respiratory: negative for cough and wheezing Cardiovascular: negative for chest pain, fatigue and palpitations Gastrointestinal: negative for abdominal pain and change in bowel habits Genitourinary:negative Integument/breast: negative for nipple discharge Musculoskeletal:negative for myalgias Neurological: negative for gait problems and tremors Behavioral/Psych: negative for abusive relationship, depression Endocrine: negative for temperature intolerance     Lab Review Urine pregnancy test Labs reviewed yes Radiologic studies reviewed yes  Objective:  BP 136/85 mmHg  Pulse 78  Wt 125 lb (56.7 kg)  LMP 03/27/2015 General:   alert  Skin:   no rash or abnormalities  Lungs:   clear to auscultation bilaterally  Heart:   regular rate and rhythm, S1, S2 normal, no murmur, click, rub or gallop  Breasts:   normal without suspicious masses, skin or nipple changes or axillary nodes  Abdomen:  normal findings: no organomegaly, soft, non-tender and no hernia  Pelvis:  External genitalia: normal general appearance Urinary system: urethral meatus normal and bladder without fullness, nontender Vaginal: normal without tenderness, induration or masses Cervix: normal appearance Adnexa: normal bimanual exam Uterus: anteverted and non-tender, normal size     Assessment:    The patient has menometrorrhagia.  PALM-COEIN classification: anovulatory BV Dysmenorrhea   Plan:   Wants Endometrial Ablation D/C Nuva Ring Provera Rx for endometrial preparation for Ablation Flagyl Rx Ibuprofen  Rx  All questions answered. Neurosurgeon distributed. Contraception: tubal ligation. Follow up in 4 weeks.    Meds ordered this encounter  Medications  . medroxyPROGESTERone (PROVERA) 10 MG tablet     Sig: Take 1 tablet (10 mg total) by mouth daily.    Dispense:  30 tablet    Refill:  0  . metroNIDAZOLE (FLAGYL) 500 MG tablet    Sig: Take 1 tablet (500 mg total) by mouth 2 (two) times daily.    Dispense:  14 tablet    Refill:  2   No orders of the defined types were placed in this encounter.

## 2015-05-03 ENCOUNTER — Encounter: Payer: Self-pay | Admitting: Obstetrics

## 2015-05-03 ENCOUNTER — Ambulatory Visit (INDEPENDENT_AMBULATORY_CARE_PROVIDER_SITE_OTHER): Payer: 59 | Admitting: Obstetrics

## 2015-05-03 VITALS — BP 147/101 | HR 98 | Temp 100.2°F | Ht 63.0 in | Wt 123.0 lb

## 2015-05-03 DIAGNOSIS — Z30011 Encounter for initial prescription of contraceptive pills: Secondary | ICD-10-CM

## 2015-05-03 DIAGNOSIS — N939 Abnormal uterine and vaginal bleeding, unspecified: Secondary | ICD-10-CM

## 2015-05-03 DIAGNOSIS — K0262 Dental caries on smooth surface penetrating into dentin: Secondary | ICD-10-CM

## 2015-05-03 DIAGNOSIS — N946 Dysmenorrhea, unspecified: Secondary | ICD-10-CM

## 2015-05-03 DIAGNOSIS — Z3041 Encounter for surveillance of contraceptive pills: Secondary | ICD-10-CM

## 2015-05-03 MED ORDER — NORGESTIMATE-ETH ESTRADIOL 0.25-35 MG-MCG PO TABS: 1.0000 | ORAL_TABLET | Freq: Every day | ORAL | Status: DC

## 2015-05-03 MED ORDER — TRIAMTERENE-HCTZ 37.5-25 MG PO CAPS: 1.0000 | ORAL_CAPSULE | Freq: Every day | ORAL | Status: DC

## 2015-05-03 MED ORDER — ETONOGESTREL-ETHINYL ESTRADIOL 0.12-0.015 MG/24HR VA RING: 1.0000 | VAGINAL_RING | VAGINAL | Status: DC

## 2015-05-03 MED ORDER — CLINDAMYCIN HCL 300 MG PO CAPS: 300.0000 mg | ORAL_CAPSULE | Freq: Three times a day (TID) | ORAL | Status: DC

## 2015-05-03 NOTE — Progress Notes (Signed)
Patient ID: Erika [name], female   DOB: 01-08-1981, 34 y.o.   MRN: [id number]  Chief Complaint  Patient presents with  . Follow-up    HPI Erika [name] is a 34 y.o. female.  H/O AUB with irregular periods.  Has been on Provera x 1 month.  Presents for F/U.  Considering options of Ablation vs medical management with OCP cycle regulation.  HPI  Past Medical History  Diagnosis Date  . Hypertension   . Anemia   . Abnormal Pap smear   . Fibroid     Past Surgical History  Procedure Laterality Date  . Tubal ligation    . Iud removal    . Leep      Family History  Problem Relation Age of Onset  . Cancer Mother   . Diabetes Father     Social History History  Substance Use Topics  . Smoking status: Current Every Day Smoker  . Smokeless tobacco: Never Used  . Alcohol Use: 0.0 oz/week    0 Standard drinks or equivalent per week     Comment: occasionally    No Known Allergies  Current Outpatient Prescriptions  Medication Sig Dispense Refill  . ibuprofen (ADVIL,MOTRIN) 800 MG tablet Take 1 tablet (800 mg total) by mouth every 8 (eight) hours as needed for mild pain, moderate pain or cramping. 30 tablet 5  . medroxyPROGESTERone (PROVERA) 10 MG tablet Take 1 tablet (10 mg total) by mouth daily. 30 tablet 0  . clindamycin (CLEOCIN) 300 MG capsule Take 1 capsule (300 mg total) by mouth every 8 (eight) hours. 21 capsule 2  . etonogestrel-ethinyl estradiol (NUVARING) 0.12-0.015 MG/24HR vaginal ring Place 1 each vaginally every 21 ( twenty-one) days. Insert one (1) ring vaginally and leave in place for 25 days, then replace. 1 each 11  . metroNIDAZOLE (FLAGYL) 500 MG tablet Take 1 tablet (500 mg total) by mouth 2 (two) times daily. (Patient not taking: Reported on 05/03/2015) 14 tablet 2  . norgestimate-ethinyl estradiol (ORTHO-CYCLEN,SPRINTEC,PREVIFEM) 0.25-35 MG-MCG tablet Take 1 tablet by mouth daily. 1 Package 11  . triamterene-hydrochlorothiazide (DYAZIDE) 37.5-25 MG per capsule  Take 1 each (1 capsule total) by mouth daily. 30 capsule 11   No current facility-administered medications for this visit.    Review of Systems Review of Systems Constitutional: negative for fatigue and weight loss Respiratory: negative for cough and wheezing Cardiovascular: negative for chest pain, fatigue and palpitations Gastrointestinal: negative for abdominal pain and change in bowel habits Genitourinary: irregular periods Integument/breast: negative for nipple discharge Musculoskeletal:negative for myalgias Neurological: negative for gait problems and tremors Behavioral/Psych: negative for abusive relationship, depression Endocrine: negative for temperature intolerance     Blood pressure 147/101, pulse 98, temperature 100.2 F (37.9 C), height 5\' 3"  (1.6 m), weight 123 lb (55.792 kg), last menstrual period 04/12/2015.  Physical Exam Physical Exam:  Deferred                                                                                            100% of 10 min visit spent on counseling and coordination of care.   Data Reviewed Recent labs Ultrasound  Assessment     AUB Dysmenorrhea  Hypertension Infected wisdom tooth    Plan    OCP's Rx for cycle regulation x 3 months, then off 2 months Dyazide Rx for HTN Clindamycin Rx F/U 5 months   No orders of the defined types were placed in this encounter.   Meds ordered this encounter  Medications  . etonogestrel-ethinyl estradiol (NUVARING) 0.12-0.015 MG/24HR vaginal ring    Sig: Place 1 each vaginally every 21 ( twenty-one) days. Insert one (1) ring vaginally and leave in place for 25 days, then replace.    Dispense:  1 each    Refill:  11  . norgestimate-ethinyl estradiol (ORTHO-CYCLEN,SPRINTEC,PREVIFEM) 0.25-35 MG-MCG tablet    Sig: Take 1 tablet by mouth daily.    Dispense:  1 Package    Refill:  11  . clindamycin (CLEOCIN) 300 MG capsule    Sig: Take 1 capsule (300 mg total) by mouth every 8 (eight) hours.     Dispense:  21 capsule    Refill:  2  . triamterene-hydrochlorothiazide (DYAZIDE) 37.5-25 MG per capsule    Sig: Take 1 each (1 capsule total) by mouth daily.    Dispense:  30 capsule    Refill:  11    Take in am

## 2015-06-01 ENCOUNTER — Ambulatory Visit: Payer: 59 | Admitting: Obstetrics

## 2015-07-06 ENCOUNTER — Ambulatory Visit: Payer: PRIVATE HEALTH INSURANCE | Admitting: Certified Nurse Midwife

## 2015-08-10 ENCOUNTER — Ambulatory Visit: Payer: PRIVATE HEALTH INSURANCE | Admitting: Obstetrics

## 2015-10-04 ENCOUNTER — Ambulatory Visit: Payer: PRIVATE HEALTH INSURANCE | Admitting: Obstetrics

## 2015-10-06 ENCOUNTER — Encounter: Payer: Self-pay | Admitting: Obstetrics

## 2015-10-06 ENCOUNTER — Ambulatory Visit (INDEPENDENT_AMBULATORY_CARE_PROVIDER_SITE_OTHER): Payer: 59 | Admitting: Certified Nurse Midwife

## 2015-10-06 VITALS — BP 142/94 | HR 94 | Wt 129.0 lb

## 2015-10-06 DIAGNOSIS — Z803 Family history of malignant neoplasm of breast: Secondary | ICD-10-CM

## 2015-10-06 DIAGNOSIS — Z01419 Encounter for gynecological examination (general) (routine) without abnormal findings: Secondary | ICD-10-CM

## 2015-10-06 DIAGNOSIS — Z113 Encounter for screening for infections with a predominantly sexual mode of transmission: Secondary | ICD-10-CM

## 2015-10-06 LAB — CBC WITH DIFFERENTIAL/PLATELET
Basophils Absolute: 0.1 10*3/uL (ref 0.0–0.1)
Basophils Relative: 1 % (ref 0–1)
Eosinophils Absolute: 0.6 10*3/uL (ref 0.0–0.7)
Eosinophils Relative: 7 % — ABNORMAL HIGH (ref 0–5)
HCT: 33.2 % — ABNORMAL LOW (ref 36.0–46.0)
Hemoglobin: 10.4 g/dL — ABNORMAL LOW (ref 12.0–15.0)
Lymphocytes Relative: 23 % (ref 12–46)
Lymphs Abs: 2 10*3/uL (ref 0.7–4.0)
MCH: 21.7 pg — ABNORMAL LOW (ref 26.0–34.0)
MCHC: 31.3 g/dL (ref 30.0–36.0)
MCV: 69.2 fL — ABNORMAL LOW (ref 78.0–100.0)
MPV: 8.6 fL (ref 8.6–12.4)
Monocytes Absolute: 0.5 10*3/uL (ref 0.1–1.0)
Monocytes Relative: 6 % (ref 3–12)
Neutro Abs: 5.4 10*3/uL (ref 1.7–7.7)
Neutrophils Relative %: 63 % (ref 43–77)
Platelets: 375 10*3/uL (ref 150–400)
RBC: 4.8 MIL/uL (ref 3.87–5.11)
RDW: 16.6 % — ABNORMAL HIGH (ref 11.5–15.5)
WBC: 8.6 10*3/uL (ref 4.0–10.5)

## 2015-10-06 LAB — COMPREHENSIVE METABOLIC PANEL
ALT: 6 U/L (ref 6–29)
AST: 12 U/L (ref 10–30)
Albumin: 4 g/dL (ref 3.6–5.1)
Alkaline Phosphatase: 52 U/L (ref 33–115)
BUN: 10 mg/dL (ref 7–25)
CO2: 22 mmol/L (ref 20–31)
Calcium: 9.2 mg/dL (ref 8.6–10.2)
Chloride: 105 mmol/L (ref 98–110)
Creat: 0.59 mg/dL (ref 0.50–1.10)
Glucose, Bld: 70 mg/dL (ref 65–99)
Potassium: 4.7 mmol/L (ref 3.5–5.3)
Sodium: 139 mmol/L (ref 135–146)
Total Bilirubin: 0.4 mg/dL (ref 0.2–1.2)
Total Protein: 6.5 g/dL (ref 6.1–8.1)

## 2015-10-06 LAB — RPR

## 2015-10-06 NOTE — Progress Notes (Signed)
Patient ID: Erika [name], female   DOB: [birth year], 34 y.o.   MRN: [id number]    Subjective:        Erika [name] is a 34 y.o. female here for a routine exam.  Current complaints: none.  Desires genetic screening for BCA both her mother and sister were dx with BCA in their early 66's.  Hx of abnormal pap smears.  Had colpo last year, history of LEEP.        Personal health questionnaire:  Is patient Erika [name], have a family history of breast and/or ovarian cancer: yes Is there a family history of uterine cancer diagnosed at age < 6, gastrointestinal cancer, urinary tract cancer, family member who is a Field seismologist syndrome-associated carrier: no Is the patient overweight and hypertensive, family history of diabetes, personal history of gestational diabetes, preeclampsia or PCOS: no Is patient over 93, have PCOS,  family history of premature CHD under age 52, diabetes, smoke, have hypertension or peripheral artery disease:  Yes, father DM At any time, has a partner hit, kicked or otherwise hurt or frightened you?: no Over the past 2 weeks, have you felt down, depressed or hopeless?: no Over the past 2 weeks, have you felt little interest or pleasure in doing things?:no   Gynecologic History Patient's last menstrual period was 09/18/2015. Contraception: OCP (estrogen/progesterone) and tubal ligation Last Pap: 02/07/15. Results were: abnormal:  LSIL & CIN 1.   Last mammogram: 03/14/15. Results were: normal  Obstetric History OB History  Gravida Para Term Preterm AB SAB TAB Ectopic Multiple Living  3 2 2  1 1    2     # Outcome Date GA Lbr Len/2nd Weight Sex Delivery Anes PTL Lv  3 SAB           2 Term      Vag-Spont     1 Term      Vag-Spont         Past Medical History  Diagnosis Date  . Hypertension   . Anemia   . Abnormal Pap smear   . Fibroid     Past Surgical History  Procedure Laterality Date  . Tubal ligation    . Iud removal    . Leep       Current  outpatient prescriptions:  .  clindamycin (CLEOCIN) 300 MG capsule, Take 1 capsule (300 mg total) by mouth every 8 (eight) hours. (Patient not taking: Reported on 10/06/2015), Disp: 21 capsule, Rfl: 2 .  etonogestrel-ethinyl estradiol (NUVARING) 0.12-0.015 MG/24HR vaginal ring, Place 1 each vaginally every 21 ( twenty-one) days. Insert one (1) ring vaginally and leave in place for 25 days, then replace. (Patient not taking: Reported on 10/06/2015), Disp: 1 each, Rfl: 11 .  ibuprofen (ADVIL,MOTRIN) 800 MG tablet, Take 1 tablet (800 mg total) by mouth every 8 (eight) hours as needed for mild pain, moderate pain or cramping. (Patient not taking: Reported on 10/06/2015), Disp: 30 tablet, Rfl: 5 .  medroxyPROGESTERone (PROVERA) 10 MG tablet, Take 1 tablet (10 mg total) by mouth daily. (Patient not taking: Reported on 10/06/2015), Disp: 30 tablet, Rfl: 0 .  metroNIDAZOLE (FLAGYL) 500 MG tablet, Take 1 tablet (500 mg total) by mouth 2 (two) times daily. (Patient not taking: Reported on 05/03/2015), Disp: 14 tablet, Rfl: 2 .  norgestimate-ethinyl estradiol (ORTHO-CYCLEN,SPRINTEC,PREVIFEM) 0.25-35 MG-MCG tablet, Take 1 tablet by mouth daily. (Patient not taking: Reported on 10/06/2015), Disp: 1 Package, Rfl: 11 .  triamterene-hydrochlorothiazide (DYAZIDE) 37.5-25 MG per capsule, Take 1  each (1 capsule total) by mouth daily. (Patient not taking: Reported on 10/06/2015), Disp: 30 capsule, Rfl: 11 No Known Allergies  Social History  Substance Use Topics  . Smoking status: Current Every Day Smoker  . Smokeless tobacco: Never Used  . Alcohol Use: 0.0 oz/week    0 Standard drinks or equivalent per week     Comment: occasionally    Family History  Problem Relation Age of Onset  . Cancer Mother   . Diabetes Father       Review of Systems  Constitutional: negative for fatigue and weight loss Respiratory: negative for cough and wheezing Cardiovascular: negative for chest pain, fatigue and  palpitations Gastrointestinal: negative for abdominal pain and change in bowel habits Musculoskeletal:negative for myalgias Neurological: negative for gait problems and tremors Behavioral/Psych: negative for abusive relationship, depression Endocrine: negative for temperature intolerance   Genitourinary:negative for abnormal menstrual periods, genital lesions, hot flashes, sexual problems and vaginal discharge Integument/breast: negative for breast lump, breast tenderness, nipple discharge and skin lesion(s)    Objective:       BP 142/94 mmHg  Pulse 94  Wt 129 lb (58.514 kg)  LMP 09/18/2015 General:   alert  Skin:   no rash or abnormalities  Lungs:   clear to auscultation bilaterally  Heart:   regular rate and rhythm, S1, S2 normal, no murmur, click, rub or gallop  Breasts:   normal without suspicious masses, skin or nipple changes or axillary nodes  Abdomen:  normal findings: no organomegaly, soft, non-tender and no hernia  Pelvis:  External genitalia: normal general appearance Urinary system: urethral meatus normal and bladder without fullness, nontender Vaginal: normal without tenderness, induration or masses Cervix: normal appearance Adnexa: normal bimanual exam Uterus: anteverted and non-tender, normal size   Lab Review Urine pregnancy test Labs reviewed yes Radiologic studies reviewed yes  50% of 30 min visit spent on counseling and coordination of care.   Assessment:    Healthy female exam.   High risk family hx of BCA  Hx of abnormal pap smears  Contraception management  STD screening exam  Plan:    Education reviewed: calcium supplements, depression evaluation, low fat, low cholesterol diet, safe sex/STD prevention, self breast exams, skin cancer screening and weight bearing exercise. Contraception: OCP (estrogen/progesterone). Follow up in: 1 year.   No orders of the defined types were placed in this encounter.   Orders Placed This Encounter  Procedures   . SureSwab, Vaginosis/Vaginitis Plus  . HIV antibody (with reflex)  . Hepatitis B surface antigen  . RPR  . Hepatitis C antibody  . CBC with Differential/Platelet  . Comprehensive metabolic panel  . TSH  . Ambulatory referral to Genetics    Referral Priority:  Routine    Referral Type:  Consultation    Referral Reason:  Specialty Services Required    Number of Visits Requested:  1    Possible management options include: another form of contraception for hx of AUB: ie: Mirena

## 2015-10-07 LAB — HIV ANTIBODY (ROUTINE TESTING W REFLEX): HIV 1&2 Ab, 4th Generation: NONREACTIVE

## 2015-10-07 LAB — HEPATITIS C ANTIBODY: HCV Ab: NEGATIVE

## 2015-10-07 LAB — HEPATITIS B SURFACE ANTIGEN: Hepatitis B Surface Ag: NEGATIVE

## 2015-10-07 LAB — TSH: TSH: 0.498 u[IU]/mL (ref 0.350–4.500)

## 2015-10-10 LAB — SURESWAB, VAGINOSIS/VAGINITIS PLUS
Atopobium vaginae: NOT DETECTED Log (cells/mL)
C. albicans, DNA: NOT DETECTED
C. glabrata, DNA: NOT DETECTED
C. parapsilosis, DNA: NOT DETECTED
C. trachomatis RNA, TMA: NOT DETECTED
C. tropicalis, DNA: NOT DETECTED
Gardnerella vaginalis: NOT DETECTED Log (cells/mL)
LACTOBACILLUS SPECIES: NOT DETECTED Log (cells/mL)
MEGASPHAERA SPECIES: NOT DETECTED Log (cells/mL)
N. gonorrhoeae RNA, TMA: NOT DETECTED
T. vaginalis RNA, QL TMA: NOT DETECTED

## 2015-10-10 LAB — PAP, TP IMAGING W/ HPV RNA, RFLX HPV TYPE 16,18/45: HPV mRNA, High Risk: NOT DETECTED

## 2015-10-11 ENCOUNTER — Telehealth: Payer: Self-pay | Admitting: Genetic Counselor

## 2015-10-11 NOTE — Telephone Encounter (Signed)
Attempted to lv mess but mailbox is full.

## 2015-10-23 ENCOUNTER — Telehealth: Payer: Self-pay | Admitting: Genetic Counselor

## 2015-10-23 NOTE — Telephone Encounter (Signed)
SPOKE WITH PT AND SHE WANTS TO WAIT AND WILL CALL BACK WHEN READY TO SCHEDULE

## 2016-03-19 ENCOUNTER — Encounter: Payer: Self-pay | Admitting: Obstetrics

## 2016-03-19 ENCOUNTER — Ambulatory Visit (INDEPENDENT_AMBULATORY_CARE_PROVIDER_SITE_OTHER): Payer: 59 | Admitting: Obstetrics

## 2016-03-19 VITALS — BP 123/81 | HR 93 | Wt 127.0 lb

## 2016-03-19 DIAGNOSIS — N939 Abnormal uterine and vaginal bleeding, unspecified: Secondary | ICD-10-CM

## 2016-03-19 DIAGNOSIS — N926 Irregular menstruation, unspecified: Secondary | ICD-10-CM

## 2016-03-19 DIAGNOSIS — Z3041 Encounter for surveillance of contraceptive pills: Secondary | ICD-10-CM

## 2016-03-19 LAB — POCT URINALYSIS DIPSTICK
Blood, UA: 250
Glucose, UA: NEGATIVE
Leukocytes, UA: NEGATIVE
Nitrite, UA: NEGATIVE
Protein, UA: NEGATIVE
Spec Grav, UA: 1.02
Urobilinogen, UA: NEGATIVE
pH, UA: 5

## 2016-03-19 MED ORDER — NORGESTIMATE-ETH ESTRADIOL 0.25-35 MG-MCG PO TABS: 1.0000 | ORAL_TABLET | Freq: Every day | ORAL | Status: DC

## 2016-03-19 MED ORDER — MEDROXYPROGESTERONE ACETATE 10 MG PO TABS: 10.0000 mg | ORAL_TABLET | Freq: Every day | ORAL | Status: DC

## 2016-03-19 NOTE — Progress Notes (Signed)
Patient ID: Erika [name], female   DOB: 1981/02/05, 35 y.o.   MRN: [id number]    Chief Complaint  Patient presents with  . Menstrual Problem    irregular q 2 weeks  & a brown d/c    HPI Erika [name] is a 35 y.o. female.  Irregular menses, twice a month.  Contraception:  Tubal Sterilization HPI  Past Medical History  Diagnosis Date  . Hypertension   . Anemia   . Abnormal Pap smear   . Fibroid     Past Surgical History  Procedure Laterality Date  . Tubal ligation    . Iud removal    . Leep      Family History  Problem Relation Age of Onset  . Cancer Mother   . Diabetes Father     Social History Social History  Substance Use Topics  . Smoking status: Current Every Day Smoker  . Smokeless tobacco: Never Used  . Alcohol Use: 0.0 oz/week    0 Standard drinks or equivalent per week     Comment: occasionally    No Known Allergies  Current Outpatient Prescriptions  Medication Sig Dispense Refill  . ibuprofen (ADVIL,MOTRIN) 800 MG tablet Take 1 tablet (800 mg total) by mouth every 8 (eight) hours as needed for mild pain, moderate pain or cramping. 30 tablet 5  . medroxyPROGESTERone (PROVERA) 10 MG tablet Take 1 tablet (10 mg total) by mouth daily. 30 tablet 0  . norgestimate-ethinyl estradiol (ORTHO-CYCLEN,SPRINTEC,PREVIFEM) 0.25-35 MG-MCG tablet Take 1 tablet by mouth daily. 1 Package 11   No current facility-administered medications for this visit.    Review of Systems Review of Systems Constitutional: negative for fatigue and weight loss Respiratory: negative for cough and wheezing Cardiovascular: negative for chest pain, fatigue and palpitations Gastrointestinal: negative for abdominal pain and change in bowel habits Genitourinary: positive for irregular cycles Integument/breast: negative for nipple discharge Musculoskeletal:negative for myalgias Neurological: negative for gait problems and tremors Behavioral/Psych: negative for abusive relationship,  depression Endocrine: negative for temperature intolerance     Blood pressure 123/81, pulse 93, weight 127 lb (57.607 kg).  Physical Exam Physical Exam:  Deferred   100% of 10 min visit spent on counseling and coordination of care.   Data Reviewed Labs  Assessment     AUB - Hormonal Imbalance     Plan    OCP's Rx for cycle regulation F/U 1 month   Orders Placed This Encounter  Procedures  . POCT urinalysis dipstick   Meds ordered this encounter  Medications  . medroxyPROGESTERone (PROVERA) 10 MG tablet    Sig: Take 1 tablet (10 mg total) by mouth daily.    Dispense:  30 tablet    Refill:  0  . norgestimate-ethinyl estradiol (ORTHO-CYCLEN,SPRINTEC,PREVIFEM) 0.25-35 MG-MCG tablet    Sig: Take 1 tablet by mouth daily.    Dispense:  1 Package    Refill:  11

## 2016-04-04 ENCOUNTER — Ambulatory Visit: Payer: 59 | Admitting: Certified Nurse Midwife

## 2016-04-19 ENCOUNTER — Ambulatory Visit (INDEPENDENT_AMBULATORY_CARE_PROVIDER_SITE_OTHER): Payer: 59 | Admitting: Obstetrics

## 2016-04-19 VITALS — BP 147/97 | HR 92 | Wt 127.0 lb

## 2016-04-19 DIAGNOSIS — N939 Abnormal uterine and vaginal bleeding, unspecified: Secondary | ICD-10-CM | POA: Diagnosis not present

## 2016-04-19 DIAGNOSIS — Z3169 Encounter for other general counseling and advice on procreation: Secondary | ICD-10-CM

## 2016-04-19 DIAGNOSIS — Z3040 Encounter for surveillance of contraceptives, unspecified: Secondary | ICD-10-CM

## 2016-04-19 NOTE — Progress Notes (Signed)
Patient ID: Erika [name], female   DOB: 1981/08/26, 35 y.o.   MRN: [id number]  Chief Complaint  Patient presents with  . Follow-up    HPI Erika [name] is a 35 y.o. female.  Follow up for AUB, on OCP's.  HPI  Past Medical History  Diagnosis Date  . Hypertension   . Anemia   . Abnormal Pap smear   . Fibroid     Past Surgical History  Procedure Laterality Date  . Tubal ligation    . Iud removal    . Leep      Family History  Problem Relation Age of Onset  . Cancer Mother   . Diabetes Father     Social History Social History  Substance Use Topics  . Smoking status: Current Every Day Smoker  . Smokeless tobacco: Never Used  . Alcohol Use: 0.0 oz/week    0 Standard drinks or equivalent per week     Comment: occasionally    No Known Allergies  Current Outpatient Prescriptions  Medication Sig Dispense Refill  . norgestimate-ethinyl estradiol (ORTHO-CYCLEN,SPRINTEC,PREVIFEM) 0.25-35 MG-MCG tablet Take 1 tablet by mouth daily. 1 Package 11  . ibuprofen (ADVIL,MOTRIN) 800 MG tablet Take 1 tablet (800 mg total) by mouth every 8 (eight) hours as needed for mild pain, moderate pain or cramping. 30 tablet 5  . medroxyPROGESTERone (PROVERA) 10 MG tablet Take 1 tablet (10 mg total) by mouth daily. 30 tablet 0   No current facility-administered medications for this visit.    Review of Systems Review of Systems Constitutional: negative for fatigue and weight loss Respiratory: negative for cough and wheezing Cardiovascular: negative for chest pain, fatigue and palpitations Gastrointestinal: negative for abdominal pain and change in bowel habits Genitourinary:positive for heavy and painful periods Integument/breast: negative for nipple discharge Musculoskeletal:negative for myalgias Neurological: negative for gait problems and tremors Behavioral/Psych: negative for abusive relationship, depression Endocrine: negative for temperature intolerance     Blood pressure  147/97, pulse 92, weight 127 lb (57.607 kg), last menstrual period 04/07/2016.  Physical Exam Physical Exam:  Deferred  100% of 10 min visit spent on counseling and coordination of care.   Data Reviewed Labs  Assessment:  H/O AUB.  Stable on OCP's.  Pleased with Sprintec 28.   Plan:  Continue Sprintec 28 Considering future fertility.   No orders of the defined types were placed in this encounter.   No orders of the defined types were placed in this encounter.

## 2016-04-23 ENCOUNTER — Encounter: Payer: Self-pay | Admitting: Obstetrics

## 2016-07-05 ENCOUNTER — Telehealth: Payer: Self-pay | Admitting: *Deleted

## 2016-07-05 NOTE — Telephone Encounter (Signed)
Pt called to office with abnormal bleeding, stating she is having 2 week cycles.  Return call to pt. Pt would like to start on Provera as previously discussed. Pt states that she did not take when initial Rx was given.  Pt states that she would like to have ablation procedure done.  Reviewed chart with Dr Jodi Mourning.  He has advised that pt take Provera 10mg  q day x 30 days then plan for ablation. Pt is to stop her current birth control, she states that she has not been taken.  Pt has been made aware of recommendations.  Pt states that she has Rx for Provera and an appt scheduled for consult on 07-12-16.

## 2016-07-12 ENCOUNTER — Telehealth: Payer: Self-pay | Admitting: Genetic Counselor

## 2016-07-12 ENCOUNTER — Ambulatory Visit (INDEPENDENT_AMBULATORY_CARE_PROVIDER_SITE_OTHER): Payer: 59 | Admitting: Obstetrics

## 2016-07-12 ENCOUNTER — Encounter: Payer: Self-pay | Admitting: Obstetrics

## 2016-07-12 ENCOUNTER — Encounter: Payer: Self-pay | Admitting: Genetic Counselor

## 2016-07-12 VITALS — BP 125/85 | HR 88 | Temp 99.0°F | Wt 122.7 lb

## 2016-07-12 DIAGNOSIS — N939 Abnormal uterine and vaginal bleeding, unspecified: Secondary | ICD-10-CM

## 2016-07-12 DIAGNOSIS — Z3041 Encounter for surveillance of contraceptive pills: Secondary | ICD-10-CM

## 2016-07-12 LAB — POCT URINALYSIS DIPSTICK
Bilirubin, UA: NEGATIVE
Glucose, UA: NEGATIVE
Ketones, UA: NEGATIVE
Leukocytes, UA: NEGATIVE
Nitrite, UA: NEGATIVE
Spec Grav, UA: 1.02
Urobilinogen, UA: NEGATIVE
pH, UA: 5

## 2016-07-12 MED ORDER — NORGESTIMATE-ETH ESTRADIOL 0.25-35 MG-MCG PO TABS: 1.0000 | ORAL_TABLET | Freq: Every day | ORAL | 11 refills | Status: DC

## 2016-07-12 NOTE — Telephone Encounter (Signed)
Pt called in for a self referral; completed in take, answered insurance questions, mailed pt letter

## 2016-07-12 NOTE — Progress Notes (Signed)
Patient ID: Erika [name], female   DOB: [birth year], 35 y.o.   MRN: [id number] Patient ID: Erika [name], female   DOB: [birth year], 35 y.o.   MRN: [id number]  Chief Complaint  Patient presents with  . Gynecologic Exam    Patient is in the office to discuss her response to her birth control- she is using Sprentec.Patient has been using it for some time. She has had 2 cycles - one with the withdrawl week and then a 2 weks later. she was clotting with that bleeding. patient is ready to discuss the ablation procedur.    HPI Erika [name] is a 35 y.o. female.  Abnormal periods lasting 2 weeks.  Started on Provera.  Bleeding has stopped.  Patient presents for further management recommendations. HPI  Past Medical History:  Diagnosis Date  . Abnormal Pap smear   . Anemia   . Fibroid   . Hypertension     Past Surgical History:  Procedure Laterality Date  . IUD REMOVAL    . LEEP    . TUBAL LIGATION      Family History  Problem Relation Age of Onset  . Cancer Mother   . Diabetes Father   . Cancer Sister     Social History Social History  Substance Use Topics  . Smoking status: Current Every Day Smoker    Packs/day: 0.50    Types: Cigarettes  . Smokeless tobacco: Never Used  . Alcohol use 0.0 oz/week     Comment: occasionally    No Known Allergies  Current Outpatient Prescriptions  Medication Sig Dispense Refill  . ibuprofen (ADVIL,MOTRIN) 800 MG tablet Take 1 tablet (800 mg total) by mouth every 8 (eight) hours as needed for mild pain, moderate pain or cramping. 30 tablet 5  . medroxyPROGESTERone (PROVERA) 10 MG tablet Take 1 tablet (10 mg total) by mouth daily. 30 tablet 0  . norgestimate-ethinyl estradiol (ORTHO-CYCLEN,SPRINTEC,PREVIFEM) 0.25-35 MG-MCG tablet Take 1 tablet by mouth daily. 1 Package 11   No current facility-administered medications for this visit.     Review of Systems Review of Systems Constitutional: negative for fatigue and weight  loss Respiratory: negative for cough and wheezing Cardiovascular: negative for chest pain, fatigue and palpitations Gastrointestinal: negative for abdominal pain and change in bowel habits Genitourinary:positive for prolonged and heavy bleeding with period Integument/breast: negative for nipple discharge Musculoskeletal:negative for myalgias Neurological: negative for gait problems and tremors Behavioral/Psych: negative for abusive relationship, depression Endocrine: negative for temperature intolerance     Blood pressure 125/85, pulse 88, temperature 99 F (37.2 C), weight 122 lb 11.2 oz (55.7 kg), last menstrual period 06/24/2016.  Physical Exam Physical Exam:  Deferred 100% of 10 min visit spent on counseling and coordination of care.   Data Reviewed Labs  Assessment     AUB - Hormonal Imbalance    Plan    Continue Provera Restart OCP's after completion of Provera F/U in 3 months  No orders of the defined types were placed in this encounter.  Meds ordered this encounter  Medications  . norgestimate-ethinyl estradiol (ORTHO-CYCLEN,SPRINTEC,PREVIFEM) 0.25-35 MG-MCG tablet    Sig: Take 1 tablet by mouth daily.    Dispense:  1 Package    Refill:  11

## 2016-07-12 NOTE — Addendum Note (Signed)
Addended by: Clarnce Flock E on: 07/12/2016 02:43 PM   Modules accepted: Orders

## 2016-08-22 ENCOUNTER — Encounter: Payer: 59 | Admitting: Genetic Counselor

## 2016-08-22 ENCOUNTER — Other Ambulatory Visit: Payer: 59

## 2016-10-11 ENCOUNTER — Encounter: Payer: Self-pay | Admitting: Obstetrics

## 2016-10-11 ENCOUNTER — Ambulatory Visit: Payer: 59 | Admitting: Obstetrics

## 2016-10-11 ENCOUNTER — Ambulatory Visit (INDEPENDENT_AMBULATORY_CARE_PROVIDER_SITE_OTHER): Payer: 59 | Admitting: Obstetrics

## 2016-10-11 DIAGNOSIS — Z1151 Encounter for screening for human papillomavirus (HPV): Secondary | ICD-10-CM

## 2016-10-11 DIAGNOSIS — Z3041 Encounter for surveillance of contraceptive pills: Secondary | ICD-10-CM

## 2016-10-11 DIAGNOSIS — Z01419 Encounter for gynecological examination (general) (routine) without abnormal findings: Secondary | ICD-10-CM

## 2016-10-11 DIAGNOSIS — Z124 Encounter for screening for malignant neoplasm of cervix: Secondary | ICD-10-CM | POA: Diagnosis not present

## 2016-10-11 NOTE — Addendum Note (Signed)
Addended by: Manuela Schwartz C on: 10/11/2016 11:19 AM   Modules accepted: Orders

## 2016-10-11 NOTE — Progress Notes (Signed)
Subjective:        Erika [name] is a 35 y.o. female here for a routine exam.  Current complaints: None.    Personal health questionnaire:  Is patient [name], have a family history of breast and/or ovarian cancer: yes Is there a family history of uterine cancer diagnosed at age < 70, gastrointestinal cancer, urinary tract cancer, family member who is a Field seismologist syndrome-associated carrier: no Is the patient overweight and hypertensive, family history of diabetes, personal history of gestational diabetes, preeclampsia or PCOS: no Is patient over 25, have PCOS,  family history of premature CHD under age 56, diabetes, smoke, have hypertension or peripheral artery disease:  no At any time, has a partner hit, kicked or otherwise hurt or frightened you?: no Over the past 2 weeks, have you felt down, depressed or hopeless?: no Over the past 2 weeks, have you felt little interest or pleasure in doing things?:no   Gynecologic History Patient's last menstrual period was 09/26/2016. Contraception: OCP (estrogen/progesterone) Last Pap: 2016. Results were: normal Last mammogram: 2016. Results were: normal  Obstetric History OB History  Gravida Para Term Preterm AB Living  3 2 2   1 2   SAB TAB Ectopic Multiple Live Births  1            # Outcome Date GA Lbr Len/2nd Weight Sex Delivery Anes PTL Lv  3 SAB           2 Term      Vag-Spont     1 Term      Vag-Spont         Past Medical History:  Diagnosis Date  . Abnormal Pap smear   . Anemia   . Fibroid   . Hypertension     Past Surgical History:  Procedure Laterality Date  . IUD REMOVAL    . LEEP    . TUBAL LIGATION       Current Outpatient Prescriptions:  .  ibuprofen (ADVIL,MOTRIN) 800 MG tablet, Take 1 tablet (800 mg total) by mouth every 8 (eight) hours as needed for mild pain, moderate pain or cramping., Disp: 30 tablet, Rfl: 5 .  norgestimate-ethinyl estradiol (ORTHO-CYCLEN,SPRINTEC,PREVIFEM) 0.25-35 MG-MCG  tablet, Take 1 tablet by mouth daily., Disp: 1 Package, Rfl: 11 .  medroxyPROGESTERone (PROVERA) 10 MG tablet, Take 1 tablet (10 mg total) by mouth daily. (Patient not taking: Reported on 10/11/2016), Disp: 30 tablet, Rfl: 0 No Known Allergies  Social History  Substance Use Topics  . Smoking status: Current Every Day Smoker    Packs/day: 0.50    Types: Cigarettes  . Smokeless tobacco: Never Used  . Alcohol use 0.0 oz/week     Comment: occasionally    Family History  Problem Relation Age of Onset  . Cancer Mother   . Diabetes Father   . Cancer Sister       Review of Systems  Constitutional: negative for fatigue and weight loss Respiratory: negative for cough and wheezing Cardiovascular: negative for chest pain, fatigue and palpitations Gastrointestinal: negative for abdominal pain and change in bowel habits Musculoskeletal:negative for myalgias Neurological: negative for gait problems and tremors Behavioral/Psych: negative for abusive relationship, depression Endocrine: negative for temperature intolerance    Genitourinary:negative for abnormal menstrual periods, genital lesions, hot flashes, sexual problems and vaginal discharge Integument/breast: negative for breast lump, breast tenderness, nipple discharge and skin lesion(s)    Objective:       BP 136/89   Pulse (!) 103   Temp  98.1 F (36.7 C) (Oral)   Ht 5\' 3"  (1.6 m)   Wt 123 lb 8 oz (56 kg)   LMP 09/26/2016   BMI 21.88 kg/m  General:   alert  Skin:   no rash or abnormalities  Lungs:   clear to auscultation bilaterally  Heart:   regular rate and rhythm, S1, S2 normal, no murmur, click, rub or gallop  Breasts:   normal without suspicious masses, skin or nipple changes or axillary nodes  Abdomen:  normal findings: no organomegaly, soft, non-tender and no hernia  Pelvis:  External genitalia: normal general appearance Urinary system: urethral meatus normal and bladder without fullness, nontender Vaginal: normal  without tenderness, induration or masses Cervix: normal appearance Adnexa: normal bimanual exam Uterus: anteverted and non-tender, normal size   Lab Review Urine pregnancy test Labs reviewed yes Radiologic studies reviewed yes  50% of 20 min visit spent on counseling and coordination of care.    Assessment:    Healthy female exam.    Tobacco Dependence  Family history of breast CA ( 2 1st degree relatives-mother and sister )  Contraceptive Counseling and Advice.  Has had tubal sterilization and was using OCP's for cycle control.   Plan:      Education reviewed: calcium supplements, low fat, low cholesterol diet, safe sex/STD prevention, self breast exams, smoking cessation and weight bearing exercise. Contraception: OCP (estrogen/progesterone). Follow up in: 1 year.  Recommended stopping OCP's because of increased risk of clots and strokes with OCP's and tobacco consumption.  Patient has tried to stop smoking, unsuccessfully, so therefore she will stop taking OCP's.  No orders of the defined types were placed in this encounter.  No orders of the defined types were placed in this encounter.    Patient ID: Erika [name], female   DOB: 1981-08-31, 35 y.o.   MRN: [id number]

## 2016-10-15 LAB — CYTOLOGY - PAP
Adequacy: ABSENT
Diagnosis: NEGATIVE
HPV: NOT DETECTED

## 2016-10-15 LAB — NUSWAB BV AND CANDIDA, NAA
Candida albicans, NAA: NEGATIVE
Candida glabrata, NAA: NEGATIVE

## 2017-05-05 ENCOUNTER — Ambulatory Visit: Payer: 59 | Admitting: Obstetrics

## 2017-05-27 ENCOUNTER — Ambulatory Visit (INDEPENDENT_AMBULATORY_CARE_PROVIDER_SITE_OTHER): Payer: 59 | Admitting: Clinical

## 2017-05-27 DIAGNOSIS — F331 Major depressive disorder, recurrent, moderate: Secondary | ICD-10-CM | POA: Diagnosis not present

## 2017-06-09 ENCOUNTER — Ambulatory Visit (INDEPENDENT_AMBULATORY_CARE_PROVIDER_SITE_OTHER): Payer: 59 | Admitting: Clinical

## 2017-06-09 DIAGNOSIS — F331 Major depressive disorder, recurrent, moderate: Secondary | ICD-10-CM

## 2017-06-26 ENCOUNTER — Ambulatory Visit (INDEPENDENT_AMBULATORY_CARE_PROVIDER_SITE_OTHER): Payer: 59 | Admitting: Clinical

## 2017-06-26 DIAGNOSIS — F331 Major depressive disorder, recurrent, moderate: Secondary | ICD-10-CM | POA: Diagnosis not present

## 2017-07-16 ENCOUNTER — Ambulatory Visit (INDEPENDENT_AMBULATORY_CARE_PROVIDER_SITE_OTHER): Payer: 59 | Admitting: Clinical

## 2017-07-16 DIAGNOSIS — F331 Major depressive disorder, recurrent, moderate: Secondary | ICD-10-CM

## 2017-07-21 ENCOUNTER — Other Ambulatory Visit: Payer: Self-pay | Admitting: Obstetrics

## 2017-07-21 ENCOUNTER — Ambulatory Visit (INDEPENDENT_AMBULATORY_CARE_PROVIDER_SITE_OTHER): Payer: 59 | Admitting: Obstetrics

## 2017-07-21 ENCOUNTER — Encounter: Payer: Self-pay | Admitting: Obstetrics

## 2017-07-21 VITALS — BP 144/84 | HR 103 | Wt 124.6 lb

## 2017-07-21 DIAGNOSIS — N644 Mastodynia: Secondary | ICD-10-CM | POA: Insufficient documentation

## 2017-07-21 DIAGNOSIS — Z1231 Encounter for screening mammogram for malignant neoplasm of breast: Secondary | ICD-10-CM

## 2017-07-21 DIAGNOSIS — Z803 Family history of malignant neoplasm of breast: Secondary | ICD-10-CM

## 2017-07-21 DIAGNOSIS — Z113 Encounter for screening for infections with a predominantly sexual mode of transmission: Secondary | ICD-10-CM

## 2017-07-21 DIAGNOSIS — Z3202 Encounter for pregnancy test, result negative: Secondary | ICD-10-CM

## 2017-07-21 DIAGNOSIS — R102 Pelvic and perineal pain: Secondary | ICD-10-CM

## 2017-07-21 LAB — POCT URINE PREGNANCY: Preg Test, Ur: NEGATIVE

## 2017-07-21 NOTE — Addendum Note (Signed)
Addended by: Lewie Loron D on: 07/21/2017 12:25 PM   Modules accepted: Orders

## 2017-07-21 NOTE — Progress Notes (Addendum)
Patient is in the office today to discuss her symptoms- she is cramping after intercourse, breast and nipple tenderness. She has decreased her caffeine enough to know that is not the cause and she does have family history of breast cancer.  Monthly cycle- sexually active- BTL, cycles are heavy flow with clots.  Same partner for 3 years.  G3 P2, uncomplicated NSVD's. Patient ID: Erika [name], female   DOB: 1981-07-10, 36 y.o.   MRN: [id number]  Chief Complaint  Patient presents with  . Gynecologic Exam    HPI Erika [name] is a 36 y.o. female.  Complains of pain after intercourse and breast tenderness.  Heavy and painful periods. HPI  Past Medical History:  Diagnosis Date  . Abnormal Pap smear   . Anemia   . Fibroid   . Hypertension     Past Surgical History:  Procedure Laterality Date  . IUD REMOVAL    . LEEP    . TUBAL LIGATION      Family History  Problem Relation Age of Onset  . Cancer Mother   . Breast cancer Mother   . Diabetes Father   . Cancer Sister   . Breast cancer Sister     Social History Social History  Substance Use Topics  . Smoking status: Current Every Day Smoker    Packs/day: 0.25    Types: Cigarettes  . Smokeless tobacco: Never Used  . Alcohol use 0.0 oz/week     Comment: occasionally    No Known Allergies  Current Outpatient Prescriptions  Medication Sig Dispense Refill  . ferrous sulfate 325 (65 FE) MG tablet Take 1 tablet (325 mg total) by mouth 2 (two) times daily before a meal. 60 tablet 5  . ibuprofen (ADVIL,MOTRIN) 800 MG tablet Take 1 tablet (800 mg total) by mouth every 8 (eight) hours as needed for mild pain, moderate pain or cramping. 30 tablet 5  . medroxyPROGESTERone (PROVERA) 10 MG tablet Take 1 tablet (10 mg total) by mouth daily. 30 tablet 0  . Prenat-FeAsp-Meth-FA-DHA w/o A (PRENATE PIXIE) 10-0.6-0.4-200 MG CAPS Take 1 capsule by mouth daily before breakfast. 30 capsule 11   No current facility-administered medications  for this visit.     Review of Systems Review of Systems Constitutional: negative for fatigue and weight loss Respiratory: negative for cough and wheezing Cardiovascular: negative for chest pain, fatigue and palpitations Gastrointestinal: negative for abdominal pain and change in bowel habits Genitourinary:positive for pain with intercourse.  Heavy and painful periods Integument/breast: positive for breast tenderness Musculoskeletal:negative for myalgias Neurological: negative for gait problems and tremors Behavioral/Psych: negative for abusive relationship, depression Endocrine: negative for temperature intolerance      Blood pressure (!) 144/84, pulse (!) 103, weight 124 lb 9.6 oz (56.5 kg), last menstrual period 06/29/2017.  Physical Exam Physical Exam General:   alert  Skin:   no rash or abnormalities  Lungs:   clear to auscultation bilaterally  Heart:   regular rate and rhythm, S1, S2 normal, no murmur, click, rub or gallop  Breasts:   tender to palpation, and negative masses or nipple discharge  Abdomen:  normal findings: no organomegaly, soft, non-tender and no hernia  Pelvis:  External genitalia: normal general appearance Urinary system: urethral meatus normal and bladder without fullness, nontender Vaginal: normal without tenderness, induration or masses Cervix: normal appearance Adnexa: normal bimanual exam Uterus: anteverted and non-tender, normal size    50% of 15 min visit spent on counseling and coordination of care.  Data Reviewed Labs  Assessment and Plan:    1. Pelvic pain in female Rx: - US Transvaginal Non-OB; Future - US Pelvis Complete; Future - POCT urine pregnancy  2. Family history of breast cancer in mother  Rx: - annual screening mammography   3. Family history of breast cancer in sister Rx: - annual screening mammography  4. Breast tenderness in female - mammogram ordered    Plan    Follow up in 2 weeks for results  Orders  Placed This Encounter  Procedures  . US Transvaginal Non-OB    Standing Status:   Future    Number of Occurrences:   1    Standing Expiration Date:   09/20/2018    Order Specific Question:   Reason for Exam (SYMPTOM  OR DIAGNOSIS REQUIRED)    Answer:   Pelvic pain    Order Specific Question:   Preferred imaging location?    Answer:   [facility]  . US Pelvis Complete    Standing Status:   Future    Number of Occurrences:   1    Standing Expiration Date:   09/20/2018    Order Specific Question:   Reason for Exam (SYMPTOM  OR DIAGNOSIS REQUIRED)    Answer:   Pelvic pain    Order Specific Question:   Preferred imaging location?    Answer:   [facility] urine pregnancy   No orders of the defined types were placed in this encounter.

## 2017-07-22 LAB — CERVICOVAGINAL ANCILLARY ONLY
Bacterial vaginitis: NEGATIVE
Candida vaginitis: NEGATIVE
Chlamydia: NEGATIVE
Neisseria Gonorrhea: NEGATIVE
Trichomonas: NEGATIVE

## 2017-07-25 ENCOUNTER — Ambulatory Visit
Admission: RE | Admit: 2017-07-25 | Discharge: 2017-07-25 | Disposition: A | Discharge status: Home / Self Care | Payer: 59 | Source: Ambulatory Visit | Attending: Obstetrics | Admitting: Obstetrics

## 2017-07-25 ENCOUNTER — Other Ambulatory Visit: Payer: Self-pay | Admitting: Obstetrics

## 2017-07-25 DIAGNOSIS — Z1231 Encounter for screening mammogram for malignant neoplasm of breast: Secondary | ICD-10-CM

## 2017-07-26 DIAGNOSIS — N920 Excessive and frequent menstruation with regular cycle: Secondary | ICD-10-CM

## 2017-07-26 HISTORY — DX: Excessive and frequent menstruation with regular cycle: N92.0

## 2017-07-29 ENCOUNTER — Ambulatory Visit (INDEPENDENT_AMBULATORY_CARE_PROVIDER_SITE_OTHER): Payer: 59 | Admitting: Clinical

## 2017-07-29 DIAGNOSIS — F331 Major depressive disorder, recurrent, moderate: Secondary | ICD-10-CM | POA: Diagnosis not present

## 2017-07-30 ENCOUNTER — Ambulatory Visit (HOSPITAL_COMMUNITY)
Admission: RE | Admit: 2017-07-30 | Discharge: 2017-07-30 | Disposition: A | Discharge status: Home / Self Care | Payer: 59 | Source: Ambulatory Visit | Attending: Obstetrics | Admitting: Obstetrics

## 2017-07-30 DIAGNOSIS — D252 Subserosal leiomyoma of uterus: Secondary | ICD-10-CM | POA: Diagnosis not present

## 2017-07-30 DIAGNOSIS — R102 Pelvic and perineal pain: Secondary | ICD-10-CM | POA: Insufficient documentation

## 2017-08-04 ENCOUNTER — Encounter: Payer: Self-pay | Admitting: Obstetrics

## 2017-08-04 ENCOUNTER — Ambulatory Visit (INDEPENDENT_AMBULATORY_CARE_PROVIDER_SITE_OTHER): Payer: 59 | Admitting: Obstetrics

## 2017-08-04 VITALS — BP 123/84 | HR 96 | Wt 126.8 lb

## 2017-08-04 DIAGNOSIS — N946 Dysmenorrhea, unspecified: Secondary | ICD-10-CM | POA: Diagnosis not present

## 2017-08-04 DIAGNOSIS — R102 Pelvic and perineal pain unspecified side: Secondary | ICD-10-CM

## 2017-08-04 DIAGNOSIS — Z803 Family history of malignant neoplasm of breast: Secondary | ICD-10-CM

## 2017-08-04 DIAGNOSIS — N644 Mastodynia: Secondary | ICD-10-CM

## 2017-08-04 DIAGNOSIS — N939 Abnormal uterine and vaginal bleeding, unspecified: Secondary | ICD-10-CM | POA: Diagnosis not present

## 2017-08-04 MED ORDER — IBUPROFEN 800 MG PO TABS: 800.0000 mg | ORAL_TABLET | Freq: Three times a day (TID) | ORAL | 5 refills | Status: DC | PRN

## 2017-08-04 MED ORDER — MEDROXYPROGESTERONE ACETATE 10 MG PO TABS: 10.0000 mg | ORAL_TABLET | Freq: Every day | ORAL | 0 refills | Status: DC

## 2017-08-04 MED ORDER — FERROUS SULFATE 325 (65 FE) MG PO TABS: 325.0000 mg | ORAL_TABLET | Freq: Two times a day (BID) | ORAL | 5 refills | Status: DC

## 2017-08-04 MED ORDER — PRENATE PIXIE 10-0.6-0.4-200 MG PO CAPS: 1.0000 | ORAL_CAPSULE | Freq: Every day | ORAL | 11 refills | Status: DC

## 2017-08-04 NOTE — Progress Notes (Signed)
Patient is in the office to review her [place] results.

## 2017-08-04 NOTE — Progress Notes (Signed)
Patient ID: Erika [name], female   DOB: [birth year], 36 y.o.   MRN: [id number]  Chief Complaint  Patient presents with  . Advice Only    HPI Erika [name] is a 36 y.o. female.  History of painful and heavy periods.  Also has pain with intercourse.  Has breast tenderness and family history of breast CA.  Presents for results of ultrasound and mammogram. HPI  Past Medical History:  Diagnosis Date  . Abnormal Pap smear   . Anemia   . Fibroid   . Hypertension     Past Surgical History:  Procedure Laterality Date  . IUD REMOVAL    . LEEP    . TUBAL LIGATION      Family History  Problem Relation Age of Onset  . Cancer Mother   . Breast cancer Mother   . Diabetes Father   . Cancer Sister   . Breast cancer Sister     Social History Social History  Substance Use Topics  . Smoking status: Current Every Day Smoker    Packs/day: 0.25    Types: Cigarettes  . Smokeless tobacco: Never Used  . Alcohol use 0.0 oz/week     Comment: occasionally    No Known Allergies  Current Outpatient Prescriptions  Medication Sig Dispense Refill  . ibuprofen (ADVIL,MOTRIN) 800 MG tablet Take 1 tablet (800 mg total) by mouth every 8 (eight) hours as needed for mild pain, moderate pain or cramping. 30 tablet 5  . ferrous sulfate 325 (65 FE) MG tablet Take 1 tablet (325 mg total) by mouth 2 (two) times daily before a meal. 60 tablet 5  . medroxyPROGESTERone (PROVERA) 10 MG tablet Take 1 tablet (10 mg total) by mouth daily. 30 tablet 0  . Prenat-FeAsp-Meth-FA-DHA w/o A (PRENATE PIXIE) 10-0.6-0.4-200 MG CAPS Take 1 capsule by mouth daily before breakfast. 30 capsule 11   No current facility-administered medications for this visit.     Review of Systems Review of Systems Constitutional: negative for fatigue and weight loss Respiratory: negative for cough and wheezing Cardiovascular: negative for chest pain, fatigue and palpitations Gastrointestinal: negative for abdominal pain and change in  bowel habits Genitourinary:positive for painful and heavy periods and pain after intercourse Integument/breast: positive for breast tenderness Musculoskeletal:negative for myalgias Neurological: negative for gait problems and tremors Behavioral/Psych: negative for abusive relationship, depression Endocrine: negative for temperature intolerance      Blood pressure 123/84, pulse 96, weight 126 lb 12.8 oz (57.5 kg), last menstrual period 07/28/2017.  Physical Exam Physical Exam:  Deferred  >50% of 10 min visit spent on counseling and coordination of care.    Data Reviewed Ultrasound Mammogram US Pelvis Complete (Accession [id number]) (Order [id number])  Imaging  Date: 07/30/2017 Department: [facility] Released By: Cristal Generous, NT Authorizing: Shelly Bombard, MD  Exam Information   Status Exam Begun  Exam Ended   Final [99] 07/30/2017 8:16 AM 07/30/2017 8:57 AM  PACS Images   Show images for US Pelvis Complete  Study Result   CLINICAL DATA:  Pelvic pain in female.  Fibroids.  EXAM: TRANSABDOMINAL AND TRANSVAGINAL ULTRASOUND OF PELVIS  TECHNIQUE: Both transabdominal and transvaginal ultrasound examinations of the pelvis were performed. Transabdominal technique was performed for global imaging of the pelvis including uterus, ovaries, adnexal regions, and pelvic cul-de-sac. It was necessary to proceed with endovaginal exam following the transabdominal exam to visualize the endometrial stripe and ovaries.  COMPARISON:  12/30/2012  FINDINGS: Uterus  Measurements: 8.3  x 4.6 x 5.2 cm. Subserosal fibroid seen in the posterior upper uterine corpus measuring 1.6 cm, and without significant change compared to prior exam. No other fibroids identified.  Endometrium  Thickness: 9 mm.  No focal abnormality visualized.  Right ovary  Measurements: 3.0 x 1.9 x 2.2 cm. Normal appearance/no adnexal mass.  Left  ovary  Measurements: 2.9 x 1.8 x 2.3 cm. Normal appearance/no adnexal mass.  Other findings  No abnormal free fluid.  IMPRESSION: Stable posterior subserosal fibroid measuring 1.6 cm.  Normal appearance of both ovaries.   Electronically Signed   By: Earle Gell M.D.   On: 07/30/2017 09:20      MM SCREENING BREAST TOMO BILATERAL (Accession [id number]) (Order [id number])  Imaging  Date: 07/25/2017 Department: [facility] Released By: Polo Riley Authorizing: Shelly Bombard, MD  Exam Information   Status Exam Begun  Exam Ended   Final [99] 07/25/2017 7:10 AM 07/25/2017 7:26 AM  Study Result   CLINICAL DATA:  Screening.  EXAM: 2D DIGITAL SCREENING BILATERAL MAMMOGRAM WITH CAD AND ADJUNCT TOMO  COMPARISON:  Previous exam(s).  ACR Breast Density Category b: There are scattered areas of fibroglandular density.  FINDINGS: There are no findings suspicious for malignancy. Images were processed with CAD.  IMPRESSION: No mammographic evidence of malignancy. A result letter of this screening mammogram will be mailed directly to the patient.  RECOMMENDATION: Screening mammogram at age 27. (Code:SM-B-40A)  BI-RADS CATEGORY  1: Negative.   Electronically Signed   By: Lajean Manes M.D.   On: 07/25/2017 09:18     Assessment and Plan:    1. Pelvic pain in female - ultrasound WNL's - Ibuprofen Rx  2. Abnormal uterine bleeding (AUB) Rx: - medroxyPROGESTERone (PROVERA) 10 MG tablet; Take 1 tablet (10 mg total) by mouth daily.  Dispense: 30 tablet; Refill: 0 - CBC - ferrous sulfate 325 (65 FE) MG tablet; Take 1 tablet (325 mg total) by mouth 2 (two) times daily before a meal.  Dispense: 60 tablet; Refill: 5 - Prenat-FeAsp-Meth-FA-DHA w/o A (PRENATE PIXIE) 10-0.6-0.4-200 MG CAPS; Take 1 capsule by mouth daily before breakfast.  Dispense: 30 capsule; Refill: 11 - wants Endometrial Ablation  3. Dysmenorrhea Rx: -  ibuprofen (ADVIL,MOTRIN) 800 MG tablet; Take 1 tablet (800 mg total) by mouth every 8 (eight) hours as needed for mild pain, moderate pain or cramping.  Dispense: 30 tablet; Refill: 5 - CBC  4. Breast tenderness in female - mammogram WNL's - probable fibrocystic changes   5. Family history of breast cancer in mother - continue yearly mammograms at age 23        Orders Placed This Encounter  Procedures  . CBC   Meds ordered this encounter  Medications  . ibuprofen (ADVIL,MOTRIN) 800 MG tablet    Sig: Take 1 tablet (800 mg total) by mouth every 8 (eight) hours as needed for mild pain, moderate pain or cramping.    Dispense:  30 tablet    Refill:  5  . medroxyPROGESTERone (PROVERA) 10 MG tablet    Sig: Take 1 tablet (10 mg total) by mouth daily.    Dispense:  30 tablet    Refill:  0  . ferrous sulfate 325 (65 FE) MG tablet    Sig: Take 1 tablet (325 mg total) by mouth 2 (two) times daily before a meal.    Dispense:  60 tablet    Refill:  5  . Prenat-FeAsp-Meth-FA-DHA w/o A (PRENATE PIXIE) 10-0.6-0.4-200 MG  CAPS    Sig: Take 1 capsule by mouth daily before breakfast.    Dispense:  30 capsule    Refill:  11

## 2017-08-05 ENCOUNTER — Other Ambulatory Visit: Payer: Self-pay | Admitting: Obstetrics

## 2017-08-05 LAB — CBC
Hematocrit: 33 % — ABNORMAL LOW (ref 34.0–46.6)
Hemoglobin: 9.9 g/dL — ABNORMAL LOW (ref 11.1–15.9)
MCH: 21.9 pg — ABNORMAL LOW (ref 26.6–33.0)
MCHC: 30 g/dL — ABNORMAL LOW (ref 31.5–35.7)
MCV: 73 fL — ABNORMAL LOW (ref 79–97)
Platelets: 378 10*3/uL (ref 150–379)
RBC: 4.52 x10E6/uL (ref 3.77–5.28)
RDW: 19 % — ABNORMAL HIGH (ref 12.3–15.4)
WBC: 6.8 10*3/uL (ref 3.4–10.8)

## 2017-08-20 ENCOUNTER — Other Ambulatory Visit (HOSPITAL_COMMUNITY)
Admission: RE | Admit: 2017-08-20 | Discharge: 2017-08-20 | Disposition: A | Discharge status: Home / Self Care | Payer: 59 | Source: Ambulatory Visit | Attending: Obstetrics & Gynecology | Admitting: Obstetrics & Gynecology

## 2017-08-20 ENCOUNTER — Ambulatory Visit (INDEPENDENT_AMBULATORY_CARE_PROVIDER_SITE_OTHER): Payer: 59 | Admitting: Obstetrics & Gynecology

## 2017-08-20 ENCOUNTER — Encounter (HOSPITAL_COMMUNITY): Payer: Self-pay

## 2017-08-20 ENCOUNTER — Encounter: Payer: Self-pay | Admitting: Obstetrics & Gynecology

## 2017-08-20 DIAGNOSIS — Z3202 Encounter for pregnancy test, result negative: Secondary | ICD-10-CM

## 2017-08-20 DIAGNOSIS — N946 Dysmenorrhea, unspecified: Secondary | ICD-10-CM

## 2017-08-20 DIAGNOSIS — Z01818 Encounter for other preprocedural examination: Secondary | ICD-10-CM

## 2017-08-20 DIAGNOSIS — N920 Excessive and frequent menstruation with regular cycle: Secondary | ICD-10-CM | POA: Insufficient documentation

## 2017-08-20 LAB — POCT URINE PREGNANCY: Preg Test, Ur: NEGATIVE

## 2017-08-20 NOTE — Progress Notes (Signed)
Patient ID: Erika [name], female   DOB: 10/25/1981, 36 y.o.   MRN: [id number]  Chief Complaint  Patient presents with  . Advice Only  heavy painful menses  HPI Erika [name] is a 36 y.o. female.  [id number] Patient's last menstrual period was 07/28/2017 (exact date). S/p BTL, used 1600 mg ibuprofen for pain and heavy menses. She has used provera in the past and requests surgical management with ablation.  HPI  Past Medical History:  Diagnosis Date  . Abnormal Pap smear   . Anemia   . Fibroid   . Hypertension     Past Surgical History:  Procedure Laterality Date  . IUD REMOVAL    . LEEP    . TUBAL LIGATION      Family History  Problem Relation Age of Onset  . Cancer Mother   . Breast cancer Mother   . Diabetes Father   . Cancer Sister   . Breast cancer Sister     Social History Social History  Substance Use Topics  . Smoking status: Former Smoker    Packs/day: 0.25    Types: Cigarettes  . Smokeless tobacco: Never Used  . Alcohol use 0.0 oz/week     Comment: occasionally    No Known Allergies  Current Outpatient Prescriptions  Medication Sig Dispense Refill  . ferrous sulfate 325 (65 FE) MG tablet Take 1 tablet (325 mg total) by mouth 2 (two) times daily before a meal. 60 tablet 5  . ibuprofen (ADVIL,MOTRIN) 800 MG tablet Take 1 tablet (800 mg total) by mouth every 8 (eight) hours as needed for mild pain, moderate pain or cramping. 30 tablet 5  . medroxyPROGESTERone (PROVERA) 10 MG tablet Take 1 tablet (10 mg total) by mouth daily. 30 tablet 0  . Prenat-FeAsp-Meth-FA-DHA w/o A (PRENATE PIXIE) 10-0.6-0.4-200 MG CAPS Take 1 capsule by mouth daily before breakfast. 30 capsule 11   No current facility-administered medications for this visit.     Review of Systems Review of Systems  Constitutional: Negative.   Gastrointestinal: Negative.   Genitourinary: Positive for menstrual problem and pelvic pain. Negative for vaginal discharge.    Last menstrual period  07/28/2017.  Physical Exam Physical Exam  Constitutional: She appears well-developed. No distress.  Cardiovascular: Normal rate.   Abdominal: Soft. She exhibits no distension.  Genitourinary: Vagina normal and uterus normal.  Skin: Skin is warm and dry. No pallor.  Psychiatric: She has a normal mood and affect. Her behavior is normal.    Data Reviewed CLINICAL DATA:  Pelvic pain in female.  Fibroids.  EXAM: TRANSABDOMINAL AND TRANSVAGINAL ULTRASOUND OF PELVIS  TECHNIQUE: Both transabdominal and transvaginal ultrasound examinations of the pelvis were performed. Transabdominal technique was performed for global imaging of the pelvis including uterus, ovaries, adnexal regions, and pelvic cul-de-sac. It was necessary to proceed with endovaginal exam following the transabdominal exam to visualize the endometrial stripe and ovaries.  COMPARISON:  12/30/2012  FINDINGS: Uterus  Measurements: 8.3 x 4.6 x 5.2 cm. Subserosal fibroid seen in the posterior upper uterine corpus measuring 1.6 cm, and without significant change compared to prior exam. No other fibroids identified.  Endometrium  Thickness: 9 mm.  No focal abnormality visualized.  Right ovary  Measurements: 3.0 x 1.9 x 2.2 cm. Normal appearance/no adnexal mass.  Left ovary  Measurements: 2.9 x 1.8 x 2.3 cm. Normal appearance/no adnexal mass.  Other findings  No abnormal free fluid.  IMPRESSION: Stable posterior subserosal fibroid measuring 1.6 cm.  Normal appearance of both  ovaries.   Electronically Signed   By: Earle Gell M.D.   On: 07/30/2017 09:20  CBC    Component Value Date/Time   WBC 6.8 08/04/2017 1002   WBC 8.6 10/06/2015 1037   RBC 4.52 08/04/2017 1002   RBC 4.80 10/06/2015 1037   HGB 9.9 (L) 08/04/2017 1002   HCT 33.0 (L) 08/04/2017 1002   PLT 378 08/04/2017 1002   MCV 73 (L) 08/04/2017 1002   MCH 21.9 (L) 08/04/2017 1002   MCH 21.7 (L) 10/06/2015 1037   MCHC 30.0  (L) 08/04/2017 1002   MCHC 31.3 10/06/2015 1037   RDW 19.0 (H) 08/04/2017 1002   LYMPHSABS 2.0 10/06/2015 1037   MONOABS 0.5 10/06/2015 1037   EOSABS 0.6 10/06/2015 1037   BASOSABS 0.1 10/06/2015 1037     Assessment    Menorrhagia, dysmenorrhea Moderate anemia Small uterine fibroid    Plan    Prepare for endometrial ablation with NovaSure Review bx result   The risks of surgery were discussed in detail with the patient including but not limited to: bleeding which may require transfusion or reoperation; infection which may require prolonged hospitalization or re-hospitalization and antibiotic therapy; injury to bowel, bladder, ureters and major vessels or other surrounding organs; need for additional procedures including laparotomy; thromboembolic phenomenon, incisional problems and other postoperative or anesthesia complications.  Patient was told that the likelihood that her condition and symptoms will be treated effectively with this surgical management was very high; the postoperative expectations were also discussed in detail. The patient also understands the alternative treatment options which were discussed in full. All questions were answered.  She was told that she will be contacted by our surgical scheduler regarding the time and date of her surgery; routine preoperative instructions of having nothing to eat or drink after midnight on the day prior to surgery and also coming to the hospital 1.5 hours prior to her time of surgery were also emphasized.  She was told she may be called for a preoperative appointment about a week prior to surgery and will be given further preoperative instructions at that visit. Printed patient education handouts about the procedure were given to the patient to review at home.         Emeterio Reeve 08/20/2017, 9:06 AM

## 2017-08-20 NOTE — Patient Instructions (Signed)
Endometrial Ablation Endometrial ablation is a procedure that destroys the thin inner layer of the lining of the uterus (endometrium). This procedure may be done:  To stop heavy periods.  To stop bleeding that is causing anemia.  To control irregular bleeding.  To treat bleeding caused by small tumors (fibroids) in the endometrium.  This procedure is often an alternative to major surgery, such as removal of the uterus and cervix (hysterectomy). As a result of this procedure:  You may not be able to have children. However, if you are premenopausal (you have not gone through menopause): ? You may still have a small chance of getting pregnant. ? You will need to use a reliable method of birth control after the procedure to prevent pregnancy.  You may stop having a menstrual period, or you may have only a small amount of bleeding during your period. Menstruation may return several years after the procedure.  Tell a health care provider about:  Any allergies you have.  All medicines you are taking, including vitamins, herbs, eye drops, creams, and over-the-counter medicines.  Any problems you or family members have had with the use of anesthetic medicines.  Any blood disorders you have.  Any surgeries you have had.  Any medical conditions you have. What are the risks? Generally, this is a safe procedure. However, problems may occur, including:  A hole (perforation) in the uterus or bowel.  Infection of the uterus, bladder, or vagina.  Bleeding.  Damage to other structures or organs.  An air bubble in the lung (air embolus).  Problems with pregnancy after the procedure.  Failure of the procedure.  Decreased ability to diagnose cancer in the endometrium.  What happens before the procedure?  You will have tests of your endometrium to make sure there are no pre-cancerous cells or cancer cells present.  You may have an ultrasound of the uterus.  You may be given  medicines to thin the endometrium.  Ask your health care provider about: ? Changing or stopping your regular medicines. This is especially important if you take diabetes medicines or blood thinners. ? Taking medicines such as aspirin and ibuprofen. These medicines can thin your blood. Do not take these medicines before your procedure if your doctor tells you not to.  Plan to have someone take you home from the hospital or clinic. What happens during the procedure?  You will lie on an exam table with your feet and legs supported as in a pelvic exam.  To lower your risk of infection: ? Your health care team will wash or sanitize their hands and put on germ-free (sterile) gloves. ? Your genital area will be washed with soap.  An IV tube will be inserted into one of your veins.  You will be given a medicine to help you relax (sedative).  A surgical instrument with a light and camera (resectoscope) will be inserted into your vagina and moved into your uterus. This allows your surgeon to see inside your uterus.  Endometrial tissue will be removed using one of the following methods: ? Radiofrequency. This method uses a radiofrequency-alternating electric current to remove the endometrium. ? Cryotherapy. This method uses extreme cold to freeze the endometrium. ? Heated-free liquid. This method uses a heated saltwater (saline) solution to remove the endometrium. ? Microwave. This method uses high-energy microwaves to heat up the endometrium and remove it. ? Thermal balloon. This method involves inserting a catheter with a balloon tip into the uterus. The balloon tip is   filled with heated fluid to remove the endometrium. The procedure may vary among health care providers and hospitals. What happens after the procedure?  Your blood pressure, heart rate, breathing rate, and blood oxygen level will be monitored until the medicines you were given have worn off.  As tissue healing occurs, you may  notice vaginal bleeding for 4-6 weeks after the procedure. You may also experience: ? Cramps. ? Thin, watery vaginal discharge that is light pink or brown in color. ? A need to urinate more frequently than usual. ? Nausea.  Do not drive for 24 hours if you were given a sedative.  Do not have sex or insert anything into your vagina until your health care provider approves. Summary  Endometrial ablation is done to treat the many causes of heavy menstrual bleeding.  The procedure may be done only after medications have been tried to control the bleeding.  Plan to have someone take you home from the hospital or clinic. This information is not intended to replace advice given to you by your health care provider. Make sure you discuss any questions you have with your health care provider. Document Released: 09/20/2004 Document Revised: 11/28/2016 Document Reviewed: 11/28/2016 Elsevier Interactive Patient Education  2017 [employer]  

## 2017-08-20 NOTE — Procedures (Signed)
Patient given informed consent, signed copy in the chart, time out was performed. Appropriate time out taken. . The patient was placed in the lithotomy position and the cervix brought into view with sterile speculum.  Portio of cervix cleansed x 2 with betadine swabs.  A tenaculum was placed in the anterior lip of the cervix.  Cervical dilators wer employed.The uterus was sounded for depth of 8 cm. A pipelle was introduced to into the uterus, suction created,  and an endometrial sample was obtained. All equipment was removed and accounted for.  The patient tolerated the procedure well.    Patient given post procedure instructions.   Woodroe Mode, MD 08/20/2017 9:14 AM

## 2017-08-20 NOTE — Progress Notes (Signed)
Pt presents for surgery consultation for an ablation. She wants an ablation d/t menorrhagia and dysmenorrhea.

## 2017-08-21 ENCOUNTER — Ambulatory Visit (INDEPENDENT_AMBULATORY_CARE_PROVIDER_SITE_OTHER): Payer: 59 | Admitting: Clinical

## 2017-08-21 ENCOUNTER — Encounter (HOSPITAL_BASED_OUTPATIENT_CLINIC_OR_DEPARTMENT_OTHER): Payer: Self-pay | Admitting: *Deleted

## 2017-08-21 DIAGNOSIS — F331 Major depressive disorder, recurrent, moderate: Secondary | ICD-10-CM

## 2017-08-25 ENCOUNTER — Telehealth: Payer: Self-pay

## 2017-08-25 ENCOUNTER — Other Ambulatory Visit: Payer: Self-pay | Admitting: Obstetrics & Gynecology

## 2017-08-25 NOTE — Telephone Encounter (Signed)
Returned call, no answer, left vm to call

## 2017-08-26 ENCOUNTER — Telehealth: Payer: Self-pay

## 2017-08-26 NOTE — Telephone Encounter (Signed)
Returned call, patient had questions about upcoming procedure tomorrow.

## 2017-08-27 ENCOUNTER — Encounter: Payer: Self-pay | Admitting: *Deleted

## 2017-08-27 ENCOUNTER — Ambulatory Visit (HOSPITAL_BASED_OUTPATIENT_CLINIC_OR_DEPARTMENT_OTHER): Payer: 59 | Admitting: Certified Registered"

## 2017-08-27 ENCOUNTER — Encounter (HOSPITAL_BASED_OUTPATIENT_CLINIC_OR_DEPARTMENT_OTHER)
Admission: RE | Disposition: A | Discharge status: Home / Self Care | Payer: Self-pay | Source: Ambulatory Visit | Attending: Obstetrics & Gynecology

## 2017-08-27 ENCOUNTER — Encounter (HOSPITAL_BASED_OUTPATIENT_CLINIC_OR_DEPARTMENT_OTHER): Payer: Self-pay | Admitting: *Deleted

## 2017-08-27 ENCOUNTER — Ambulatory Visit (HOSPITAL_BASED_OUTPATIENT_CLINIC_OR_DEPARTMENT_OTHER)
Admission: RE | Admit: 2017-08-27 | Discharge: 2017-08-27 | Disposition: A | Discharge status: Home / Self Care | Payer: 59 | Source: Ambulatory Visit | Attending: Obstetrics & Gynecology | Admitting: Obstetrics & Gynecology

## 2017-08-27 DIAGNOSIS — N946 Dysmenorrhea, unspecified: Secondary | ICD-10-CM | POA: Insufficient documentation

## 2017-08-27 DIAGNOSIS — D649 Anemia, unspecified: Secondary | ICD-10-CM | POA: Diagnosis not present

## 2017-08-27 DIAGNOSIS — Z79899 Other long term (current) drug therapy: Secondary | ICD-10-CM | POA: Insufficient documentation

## 2017-08-27 DIAGNOSIS — Z87891 Personal history of nicotine dependence: Secondary | ICD-10-CM | POA: Diagnosis not present

## 2017-08-27 DIAGNOSIS — Z809 Family history of malignant neoplasm, unspecified: Secondary | ICD-10-CM | POA: Insufficient documentation

## 2017-08-27 DIAGNOSIS — Z803 Family history of malignant neoplasm of breast: Secondary | ICD-10-CM | POA: Insufficient documentation

## 2017-08-27 DIAGNOSIS — N920 Excessive and frequent menstruation with regular cycle: Secondary | ICD-10-CM | POA: Insufficient documentation

## 2017-08-27 DIAGNOSIS — I10 Essential (primary) hypertension: Secondary | ICD-10-CM | POA: Diagnosis not present

## 2017-08-27 DIAGNOSIS — Z9851 Tubal ligation status: Secondary | ICD-10-CM | POA: Insufficient documentation

## 2017-08-27 DIAGNOSIS — Z833 Family history of diabetes mellitus: Secondary | ICD-10-CM | POA: Insufficient documentation

## 2017-08-27 HISTORY — PX: ENDOMETRIAL ABLATION: SHX621

## 2017-08-27 HISTORY — DX: Excessive and frequent menstruation with regular cycle: N92.0

## 2017-08-27 SURGERY — ABLATION, ENDOMETRIUM
Anesthesia: General | Site: Uterus

## 2017-08-27 MED ORDER — LIDOCAINE 2% (20 MG/ML) 5 ML SYRINGE
INTRAMUSCULAR | Status: AC
  Filled 2017-08-27: qty 5

## 2017-08-27 MED ORDER — DEXAMETHASONE SODIUM PHOSPHATE 4 MG/ML IJ SOLN
INTRAMUSCULAR | Status: DC | PRN
  Administered 2017-08-27: 10 mg via INTRAVENOUS

## 2017-08-27 MED ORDER — OXYCODONE-ACETAMINOPHEN 5-325 MG PO TABS: 1.0000 | ORAL_TABLET | Freq: Four times a day (QID) | ORAL | 0 refills | Status: DC | PRN

## 2017-08-27 MED ORDER — BUPIVACAINE-EPINEPHRINE (PF) 0.5% -1:200000 IJ SOLN
INTRAMUSCULAR | Status: AC
  Filled 2017-08-27: qty 30

## 2017-08-27 MED ORDER — KETOROLAC TROMETHAMINE 30 MG/ML IJ SOLN
INTRAMUSCULAR | Status: DC | PRN
  Administered 2017-08-27: 30 mg via INTRAVENOUS

## 2017-08-27 MED ORDER — BUPIVACAINE-EPINEPHRINE 0.5% -1:200000 IJ SOLN
INTRAMUSCULAR | Status: DC | PRN
  Administered 2017-08-27: 4.5 mL

## 2017-08-27 MED ORDER — ONDANSETRON HCL 4 MG/2ML IJ SOLN: 4.0000 mg | Freq: Four times a day (QID) | INTRAMUSCULAR | Status: DC | PRN

## 2017-08-27 MED ORDER — MIDAZOLAM HCL 2 MG/2ML IJ SOLN
1.0000 mg | INTRAMUSCULAR | Status: DC | PRN
  Administered 2017-08-27: 2 mg via INTRAVENOUS

## 2017-08-27 MED ORDER — DEXAMETHASONE SODIUM PHOSPHATE 10 MG/ML IJ SOLN
INTRAMUSCULAR | Status: AC
  Filled 2017-08-27: qty 1

## 2017-08-27 MED ORDER — MIDAZOLAM HCL 2 MG/2ML IJ SOLN
INTRAMUSCULAR | Status: AC
  Filled 2017-08-27: qty 2

## 2017-08-27 MED ORDER — OXYCODONE HCL 5 MG/5ML PO SOLN: 5.0000 mg | Freq: Once | ORAL | Status: DC | PRN

## 2017-08-27 MED ORDER — OXYCODONE HCL 5 MG PO TABS: 5.0000 mg | ORAL_TABLET | Freq: Once | ORAL | Status: DC | PRN

## 2017-08-27 MED ORDER — KETOROLAC TROMETHAMINE 30 MG/ML IJ SOLN
INTRAMUSCULAR | Status: AC
  Filled 2017-08-27: qty 1

## 2017-08-27 MED ORDER — LIDOCAINE HCL (CARDIAC) 20 MG/ML IV SOLN
INTRAVENOUS | Status: DC | PRN
  Administered 2017-08-27: 60 mg via INTRAVENOUS

## 2017-08-27 MED ORDER — LACTATED RINGERS IV SOLN: INTRAVENOUS | Status: DC

## 2017-08-27 MED ORDER — FENTANYL CITRATE (PF) 100 MCG/2ML IJ SOLN
50.0000 ug | INTRAMUSCULAR | Status: DC | PRN
  Administered 2017-08-27: 100 ug via INTRAVENOUS

## 2017-08-27 MED ORDER — FENTANYL CITRATE (PF) 100 MCG/2ML IJ SOLN
25.0000 ug | INTRAMUSCULAR | Status: DC | PRN
Start: 2017-08-27 — End: 2017-08-27

## 2017-08-27 MED ORDER — FENTANYL CITRATE (PF) 100 MCG/2ML IJ SOLN
INTRAMUSCULAR | Status: AC
  Filled 2017-08-27: qty 2

## 2017-08-27 MED ORDER — SCOPOLAMINE 1 MG/3DAYS TD PT72: 1.0000 | MEDICATED_PATCH | Freq: Once | TRANSDERMAL | Status: DC | PRN

## 2017-08-27 MED ORDER — ONDANSETRON HCL 4 MG/2ML IJ SOLN
INTRAMUSCULAR | Status: AC
  Filled 2017-08-27: qty 2

## 2017-08-27 MED ORDER — ONDANSETRON HCL 4 MG/2ML IJ SOLN
INTRAMUSCULAR | Status: DC | PRN
  Administered 2017-08-27: 4 mg via INTRAVENOUS

## 2017-08-27 MED ORDER — LACTATED RINGERS IV SOLN
INTRAVENOUS | Status: DC
  Administered 2017-08-27 (×2): via INTRAVENOUS

## 2017-08-27 MED ORDER — PROPOFOL 10 MG/ML IV BOLUS
INTRAVENOUS | Status: AC
  Filled 2017-08-27: qty 20

## 2017-08-27 MED ORDER — PROPOFOL 10 MG/ML IV BOLUS
INTRAVENOUS | Status: DC | PRN
  Administered 2017-08-27: 200 mg via INTRAVENOUS

## 2017-08-27 SURGICAL SUPPLY — 13 items
ABLATOR ENDOMETRIAL BIPOLAR (ABLATOR) ×6 IMPLANT
CANISTER SUCT 3000ML PPV (MISCELLANEOUS) ×3 IMPLANT
CATH ROBINSON RED A/P 16FR (CATHETERS) ×3 IMPLANT
CLOTH BEACON ORANGE TIMEOUT ST (SAFETY) ×3 IMPLANT
CONTAINER PREFILL 10% NBF 60ML (FORM) ×6 IMPLANT
GLOVE BIO SURGEON STRL SZ 6.5 (GLOVE) ×9 IMPLANT
GLOVE BIOGEL PI IND STRL 7.0 (GLOVE) ×6 IMPLANT
GLOVE BIOGEL PI INDICATOR 7.0 (GLOVE) ×3
GOWN STRL REUS W/TWL LRG LVL3 (GOWN DISPOSABLE) ×6 IMPLANT
PACK VAGINAL MINOR WOMEN LF (CUSTOM PROCEDURE TRAY) ×3 IMPLANT
PAD OB MATERNITY 4.3X12.25 (PERSONAL CARE ITEMS) ×3 IMPLANT
TOWEL OR 17X24 6PK STRL BLUE (TOWEL DISPOSABLE) ×6 IMPLANT
TUBING AQUILEX INFLOW (TUBING) ×3 IMPLANT

## 2017-08-27 NOTE — H&P (Signed)
Chief Complaint  Patient presents with     heavy painful menses  HPI Erika [name] is a 36 y.o. female.  [id number] Patient's last menstrual period was 07/28/2017 (exact date). S/p BTL, used 1600 mg ibuprofen for pain and heavy menses. She has used provera in the past and requests surgical management with ablation.  HPI      Past Medical History:  Diagnosis Date  . Abnormal Pap smear   . Anemia   . Fibroid   . Hypertension          Past Surgical History:  Procedure Laterality Date  . IUD REMOVAL    . LEEP    . TUBAL LIGATION           Family History  Problem Relation Age of Onset  . Cancer Mother   . Breast cancer Mother   . Diabetes Father   . Cancer Sister   . Breast cancer Sister     Social History        Social History  Substance Use Topics  . Smoking status: Former Smoker    Packs/day: 0.25    Types: Cigarettes  . Smokeless tobacco: Never Used  . Alcohol use 0.0 oz/week      Comment: occasionally    No Known Allergies        Current Outpatient Prescriptions  Medication Sig Dispense Refill  . ferrous sulfate 325 (65 FE) MG tablet Take 1 tablet (325 mg total) by mouth 2 (two) times daily before a meal. 60 tablet 5  . ibuprofen (ADVIL,MOTRIN) 800 MG tablet Take 1 tablet (800 mg total) by mouth every 8 (eight) hours as needed for mild pain, moderate pain or cramping. 30 tablet 5  . medroxyPROGESTERone (PROVERA) 10 MG tablet Take 1 tablet (10 mg total) by mouth daily. 30 tablet 0  . Prenat-FeAsp-Meth-FA-DHA w/o A (PRENATE PIXIE) 10-0.6-0.4-200 MG CAPS Take 1 capsule by mouth daily before breakfast. 30 capsule 11   No current facility-administered medications for this visit.     Review of Systems Review of Systems  Constitutional: Negative.   Gastrointestinal: Negative.   Genitourinary: Positive for menstrual problem and pelvic pain. Negative for vaginal discharge.    Last menstrual period  07/28/2017.  Physical Exam Physical Exam  Constitutional: She appears well-developed. No distress.  Cardiovascular: Normal rate.   Abdominal: Soft. She exhibits no distension.  Genitourinary: Vagina normal and uterus normal.  Skin: Skin is warm and dry. No pallor.  Psychiatric: She has a normal mood and affect. Her behavior is normal.    Data Reviewed CLINICAL DATA: Pelvic pain in female. Fibroids.  EXAM: TRANSABDOMINAL AND TRANSVAGINAL ULTRASOUND OF PELVIS  TECHNIQUE: Both transabdominal and transvaginal ultrasound examinations of the pelvis were performed. Transabdominal technique was performed for global imaging of the pelvis including uterus, ovaries, adnexal regions, and pelvic cul-de-sac. It was necessary to proceed with endovaginal exam following the transabdominal exam to visualize the endometrial stripe and ovaries.  COMPARISON: 12/30/2012  FINDINGS: Uterus  Measurements: 8.3 x 4.6 x 5.2 cm. Subserosal fibroid seen in the posterior upper uterine corpus measuring 1.6 cm, and without significant change compared to prior exam. No other fibroids identified.  Endometrium  Thickness: 9 mm. No focal abnormality visualized.  Right ovary  Measurements: 3.0 x 1.9 x 2.2 cm. Normal appearance/no adnexal mass.  Left ovary  Measurements: 2.9 x 1.8 x 2.3 cm. Normal appearance/no adnexal mass.  Other findings  No abnormal free fluid.  IMPRESSION: Stable posterior subserosal fibroid measuring 1.6  cm.  Normal appearance of both ovaries.   Electronically Signed By: Earle Gell M.D. On: 07/30/2017 09:20  CBC Labs(Brief)          Component Value Date/Time   WBC 6.8 08/04/2017 1002   WBC 8.6 10/06/2015 1037   RBC 4.52 08/04/2017 1002   RBC 4.80 10/06/2015 1037   HGB 9.9 (L) 08/04/2017 1002   HCT 33.0 (L) 08/04/2017 1002   PLT 378 08/04/2017 1002   MCV 73 (L) 08/04/2017 1002   MCH 21.9 (L) 08/04/2017 1002   MCH 21.7  (L) 10/06/2015 1037   MCHC 30.0 (L) 08/04/2017 1002   MCHC 31.3 10/06/2015 1037   RDW 19.0 (H) 08/04/2017 1002   LYMPHSABS 2.0 10/06/2015 1037   MONOABS 0.5 10/06/2015 1037   EOSABS 0.6 10/06/2015 1037   BASOSABS 0.1 10/06/2015 1037       Assessment    Menorrhagia, dysmenorrhea Moderate anemia Small uterine fibroid  Plan    Prepare for endometrial ablation with NovaSure,  bx result benign endometrium   The risks of surgery were discussed in detail with the patient including but not limited to: bleeding which may require transfusion or reoperation; infection which may require prolonged hospitalization or re-hospitalization and antibiotic therapy; injury to bowel, bladder, ureters and major vessels or other surrounding organs; need for additional procedures including laparotomy; thromboembolic phenomenon, incisional problems and other postoperative or anesthesia complications.  Patient was told that the likelihood that her condition and symptoms will be treated effectively with this surgical management was very high; the postoperative expectations were also discussed in detail. The patient also understands the alternative treatment options which were discussed in full. All questions were answered.       Woodroe Mode, MD 08/27/2017

## 2017-08-27 NOTE — Anesthesia Procedure Notes (Signed)
Procedure Name: LMA Insertion Date/Time: 08/27/2017 2:17 PM Performed by: Renatta Shrieves D Pre-anesthesia Checklist: Patient identified, Emergency Drugs available, Suction available and Patient being monitored Patient Re-evaluated:Patient Re-evaluated prior to induction Oxygen Delivery Method: Circle system utilized Preoxygenation: Pre-oxygenation with 100% oxygen Induction Type: IV induction Ventilation: Mask ventilation without difficulty LMA: LMA inserted LMA Size: 3.0 Number of attempts: 1 Airway Equipment and Method: Bite block Placement Confirmation: positive ETCO2 Tube secured with: Tape Dental Injury: Teeth and Oropharynx as per pre-operative assessment

## 2017-08-27 NOTE — Progress Notes (Signed)
Pt with questionable a fib on monitor, asymptomatic, HR 58, PVC's noted with irregular rhythm,  Denies cardiac history. Dr. Royce Macadamia made aware, EKG ordered and performed, Dr. Royce Macadamia by to review rhythm,  Sinus arrhythmia with short PR noted, Okay to discharge pat home

## 2017-08-27 NOTE — Op Note (Signed)
PREOPERATIVE DIAGNOSIS:  Irregular uterine bleeding POSTOPERATIVE DIAGNOSIS: The same PROCEDURE:  NovaSure Endometrial Ablation SURGEON:  Woodroe Mode, MD   INDICATIONS: 36 y.o. [id number]  here for NovaSure Endometrial Ablation.  Risks of surgery were discussed with the patient including but not limited to: bleeding which may require transfusion; infection which may require antibiotics; injury to uterus leading to risk of injury to surrounding intraperitoneal organs, need for additional procedures including laparoscopy or laparotomy, and other postoperative/anesthesia complications. Written informed consent was obtained.   FINDINGS:  A 4 week size uterus.    ANESTHESIA:   General and paracervical block with 30 ml of 0.5% Marcaine INTRAVENOUS FLUIDS:  500 ml of LR ESTIMATED BLOOD LOSS:  Less than 20 ml SPECIMENS: None COMPLICATIONS:  None immediate  PROCEDURE DETAILS:  The patient was taken to the operating room where general anesthesia was administered and was found to be adequate.  After an adequate timeout was performed, she was placed in the dorsal lithotomy position and examined; then prepped and draped in the sterile manner.   Her bladder was catheterized for an unmeasured amount of clear, yellow urine. A speculum was then placed in the patient's vagina and a single tooth tenaculum was applied to the anterior lip of the cervix.  A paracervical block using 0.5% Marcaine 1:200000 epinephrine was administered. The sound was used to obtain the cervical and uterine cavity length measurements at 3.5 cm and 3.5 cm respectively; total sounding length 7 cm.  The cervix was dilated manually with Hegar dilators to accommodate the NovaSure device.  The NovaSure device was inserted, and a cavity width of 4.3 cm was determined. Using a power of 95 watts, for 113 sec, the endometrial ablation was performed.  The tenaculum was removed from the anterior lip of the cervix, and the vaginal speculum was removed  after noting good hemostasis.  The patient tolerated the procedure well and was taken to the recovery area awake, extubated and in stable condition.  The patient will be discharged to home as per PACU criteria.  Routine postoperative instructions given.  She was prescribed Percocet,.  She will follow up in the clinic in 4 weeks for postoperative evaluation.  Woodroe Mode, MD 08/27/2017 2:53 PM

## 2017-08-27 NOTE — Transfer of Care (Signed)
Immediate Anesthesia Transfer of Care Note  Patient: Erika [name]  Procedure(s) Performed: NOVASURE (N/A Uterus)  Patient Location: PACU  Anesthesia Type:General  Level of Consciousness: awake, alert , oriented and patient cooperative  Airway & Oxygen Therapy: Patient Spontanous Breathing and Patient connected to face mask oxygen  Post-op Assessment: Report given to RN and Post -op Vital signs reviewed and stable  Post vital signs: Reviewed and stable  Last Vitals:  Vitals:   08/27/17 1327  BP: 132/75  Pulse: 77  Resp: 20  Temp: 37 C  SpO2: 100%    Last Pain:  Vitals:   08/27/17 1327  TempSrc: Oral  PainSc: 5          Complications: No apparent anesthesia complications

## 2017-08-27 NOTE — Anesthesia Postprocedure Evaluation (Signed)
Anesthesia Post Note  Patient: [name]  Procedure(s) Performed: ENDOMETRIAL ABLATION WITH NOVASURE (N/A Uterus)     Patient location during evaluation: PACU Anesthesia Type: General Level of consciousness: awake and alert Pain management: pain level controlled Vital Signs Assessment: post-procedure vital signs reviewed and stable Respiratory status: spontaneous breathing, nonlabored ventilation and respiratory function stable Cardiovascular status: blood pressure returned to baseline and stable Postop Assessment: no apparent nausea or vomiting Anesthetic complications: no Comments: Patient had irregular heartbeat with PAC's and PVC's noted in PACU. 12 lead EKG obtained. NSR with short PR interval.    Last Vitals:  Vitals:   08/27/17 1327 08/27/17 1452  BP: 132/75 135/88  Pulse: 77 94  Resp: 20 16  Temp: 37 C 36.6 C  SpO2: 100% 100%    Last Pain:  Vitals:   08/27/17 1500  TempSrc:   PainSc: (P) Asleep                 Shatera Rennert A.

## 2017-08-27 NOTE — Anesthesia Preprocedure Evaluation (Signed)
Anesthesia Evaluation  Patient identified by MRN, date of birth, ID band Patient awake    Reviewed: Allergy & Precautions, H&P , NPO status , Patient's Chart, lab work & pertinent test results  Airway Mallampati: II   Neck ROM: full    Dental   Pulmonary former smoker,    breath sounds clear to auscultation       Cardiovascular negative cardio ROS   Rhythm:regular Rate:Normal     Neuro/Psych    GI/Hepatic   Endo/Other    Renal/GU      Musculoskeletal   Abdominal   Peds  Hematology   Anesthesia Other Findings   Reproductive/Obstetrics                             Anesthesia Physical Anesthesia Plan  ASA: I  Anesthesia Plan: General   Post-op Pain Management:    Induction: Intravenous  PONV Risk Score and Plan: 3 and Ondansetron, Dexamethasone, Midazolam and Treatment may vary due to age or medical condition  Airway Management Planned: LMA  Additional Equipment:   Intra-op Plan:   Post-operative Plan:   Informed Consent: I have reviewed the patients History and Physical, chart, labs and discussed the procedure including the risks, benefits and alternatives for the proposed anesthesia with the patient or authorized representative who has indicated his/her understanding and acceptance.     Plan Discussed with: CRNA, Anesthesiologist and Surgeon  Anesthesia Plan Comments:         Anesthesia Quick Evaluation

## 2017-08-27 NOTE — Discharge Instructions (Signed)
°Post Anesthesia Home Care Instructions ° °Activity: °Get plenty of rest for the remainder of the day. A responsible individual must stay with you for 24 hours following the procedure.  °For the next 24 hours, DO NOT: °-Drive a car °-Operate machinery °-Drink alcoholic beverages °-Take any medication unless instructed by your physician °-Make any legal decisions or sign important papers. ° °Meals: °Start with liquid foods such as gelatin or soup. Progress to regular foods as tolerated. Avoid greasy, spicy, heavy foods. If nausea and/or vomiting occur, drink only clear liquids until the nausea and/or vomiting subsides. Call your physician if vomiting continues. ° °Special Instructions/Symptoms: °Your throat may feel dry or sore from the anesthesia or the breathing tube placed in your throat during surgery. If this causes discomfort, gargle with warm salt water. The discomfort should disappear within 24 hours. ° °If you had a scopolamine patch placed behind your ear for the management of post- operative nausea and/or vomiting: ° °1. The medication in the patch is effective for 72 hours, after which it should be removed.  Wrap patch in a tissue and discard in the trash. Wash hands thoroughly with soap and water. °2. You may remove the patch earlier than 72 hours if you experience unpleasant side effects which may include dry mouth, dizziness or visual disturbances. °3. Avoid touching the patch. Wash your hands with soap and water after contact with the patch. °  ° °Endometrial Ablation °Endometrial ablation is a procedure that destroys the thin inner layer of the lining of the uterus (endometrium). This procedure may be done: °· To stop heavy periods. °· To stop bleeding that is causing anemia. °· To control irregular bleeding. °· To treat bleeding caused by small tumors (fibroids) in the endometrium. ° °This procedure is often an alternative to major surgery, such as removal of the uterus and cervix (hysterectomy).  As a result of this procedure: °· You may not be able to have children. However, if you are premenopausal (you have not gone through menopause): °? You may still have a small chance of getting pregnant. °? You will need to use a reliable method of birth control after the procedure to prevent pregnancy. °· You may stop having a menstrual period, or you may have only a small amount of bleeding during your period. Menstruation may return several years after the procedure. ° °Tell a health care provider about: °· Any allergies you have. °· All medicines you are taking, including vitamins, herbs, eye drops, creams, and over-the-counter medicines. °· Any problems you or family members have had with the use of anesthetic medicines. °· Any blood disorders you have. °· Any surgeries you have had. °· Any medical conditions you have. °What are the risks? °Generally, this is a safe procedure. However, problems may occur, including: °· A hole (perforation) in the uterus or bowel. °· Infection of the uterus, bladder, or vagina. °· Bleeding. °· Damage to other structures or organs. °· An air bubble in the lung (air embolus). °· Problems with pregnancy after the procedure. °· Failure of the procedure. °· Decreased ability to diagnose cancer in the endometrium. ° °What happens before the procedure? °· You will have tests of your endometrium to make sure there are no pre-cancerous cells or cancer cells present. °· You may have an ultrasound of the uterus. °· You may be given medicines to thin the endometrium. °· Ask your health care provider about: °? Changing or stopping your regular medicines. This is especially important if you   take diabetes medicines or blood thinners. °? Taking medicines such as aspirin and ibuprofen. These medicines can thin your blood. Do not take these medicines before your procedure if your doctor tells you not to. °· Plan to have someone take you home from the hospital or clinic. °What happens during the  procedure? °· You will lie on an exam table with your feet and legs supported as in a pelvic exam. °· To lower your risk of infection: °? Your health care team will wash or sanitize their hands and put on germ-free (sterile) gloves. °? Your genital area will be washed with soap. °· An IV tube will be inserted into one of your veins. °· You will be given a medicine to help you relax (sedative). °· A surgical instrument with a light and camera (resectoscope) will be inserted into your vagina and moved into your uterus. This allows your surgeon to see inside your uterus. °· Endometrial tissue will be removed using one of the following methods: °? Radiofrequency. This method uses a radiofrequency-alternating electric current to remove the endometrium. °? Cryotherapy. This method uses extreme cold to freeze the endometrium. °? Heated-free liquid. This method uses a heated saltwater (saline) solution to remove the endometrium. °? Microwave. This method uses high-energy microwaves to heat up the endometrium and remove it. °? Thermal balloon. This method involves inserting a catheter with a balloon tip into the uterus. The balloon tip is filled with heated fluid to remove the endometrium. °The procedure may vary among health care providers and hospitals. °What happens after the procedure? °· Your blood pressure, heart rate, breathing rate, and blood oxygen level will be monitored until the medicines you were given have worn off. °· As tissue healing occurs, you may notice vaginal bleeding for 4-6 weeks after the procedure. You may also experience: °? Cramps. °? Thin, watery vaginal discharge that is light pink or brown in color. °? A need to urinate more frequently than usual. °? Nausea. °· Do not drive for 24 hours if you were given a sedative. °· Do not have sex or insert anything into your vagina until your health care provider approves. °Summary °· Endometrial ablation is done to treat the many causes of heavy menstrual  bleeding. °· The procedure may be done only after medications have been tried to control the bleeding. °· Plan to have someone take you home from the hospital or clinic. °This information is not intended to replace advice given to you by your health care provider. Make sure you discuss any questions you have with your health care provider. °Document Released: 09/20/2004 Document Revised: 11/28/2016 Document Reviewed: 11/28/2016 °Elsevier Interactive Patient Education © 2017 [employer] ° °

## 2017-08-28 ENCOUNTER — Telehealth: Payer: Self-pay

## 2017-08-28 ENCOUNTER — Encounter (HOSPITAL_BASED_OUTPATIENT_CLINIC_OR_DEPARTMENT_OTHER): Payer: Self-pay | Admitting: Obstetrics & Gynecology

## 2017-08-28 ENCOUNTER — Ambulatory Visit: Payer: 59 | Admitting: Clinical

## 2017-08-28 NOTE — Telephone Encounter (Signed)
Returned call and advised per provider that she can continue taking rx until pills are gone.

## 2017-08-28 NOTE — Telephone Encounter (Signed)
Returned call, no answer, left vm 

## 2017-09-03 ENCOUNTER — Ambulatory Visit (INDEPENDENT_AMBULATORY_CARE_PROVIDER_SITE_OTHER): Payer: 59 | Admitting: Clinical

## 2017-09-03 DIAGNOSIS — F331 Major depressive disorder, recurrent, moderate: Secondary | ICD-10-CM

## 2017-09-10 ENCOUNTER — Ambulatory Visit: Payer: 59 | Admitting: Clinical

## 2017-09-16 ENCOUNTER — Ambulatory Visit (INDEPENDENT_AMBULATORY_CARE_PROVIDER_SITE_OTHER): Payer: 59 | Admitting: Clinical

## 2017-09-16 DIAGNOSIS — F331 Major depressive disorder, recurrent, moderate: Secondary | ICD-10-CM | POA: Diagnosis not present

## 2017-09-24 ENCOUNTER — Ambulatory Visit (INDEPENDENT_AMBULATORY_CARE_PROVIDER_SITE_OTHER): Payer: 59 | Admitting: Clinical

## 2017-09-24 DIAGNOSIS — F331 Major depressive disorder, recurrent, moderate: Secondary | ICD-10-CM

## 2017-10-01 ENCOUNTER — Encounter: Payer: Self-pay | Admitting: Obstetrics & Gynecology

## 2017-10-01 ENCOUNTER — Ambulatory Visit: Payer: Self-pay | Admitting: Clinical

## 2017-10-01 ENCOUNTER — Ambulatory Visit (INDEPENDENT_AMBULATORY_CARE_PROVIDER_SITE_OTHER): Payer: 59 | Admitting: Obstetrics & Gynecology

## 2017-10-01 DIAGNOSIS — N920 Excessive and frequent menstruation with regular cycle: Secondary | ICD-10-CM

## 2017-10-01 DIAGNOSIS — Z9889 Other specified postprocedural states: Secondary | ICD-10-CM

## 2017-10-01 NOTE — Patient Instructions (Signed)
Mammogram A mammogram is an X-ray of the breasts that is done to check for changes that are not normal. This test can screen for and find any changes that may suggest breast cancer. This test can also help to find other changes and variations in the breast. What happens before the procedure?  Have this test done about 1-2 weeks after your period. This is usually when your breasts are the least tender.  If you are visiting a new doctor or clinic, send any past mammogram images to your new doctor's office.  Wash your breasts and under your arms the day of the test.  Do not use deodorants, perfumes, lotions, or powders on the day of the test.  Take off any jewelry from your neck.  Wear clothes that you can change into and out of easily. What happens during the procedure?  You will undress from the waist up. You will put on a gown.  You will stand in front of the X-ray machine.  Each breast will be placed between two plastic or glass plates. The plates will press down on your breast for a few seconds. Try to stay as relaxed as possible. This does not cause any harm to your breasts. Any discomfort you feel will be very brief.  X-rays will be taken from different angles of each breast. The procedure may vary among doctors and hospitals. What happens after the procedure?  The mammogram will be looked at by a specialist (radiologist).  You may need to do certain parts of the test again. This depends on the quality of the images.  Ask when your test results will be ready. Make sure you get your test results.  You may go back to your normal activities. This information is not intended to replace advice given to you by your health care provider. Make sure you discuss any questions you have with your health care provider. Document Released: 02/07/2009 Document Revised: 04/18/2016 Document Reviewed: 01/20/2015 Elsevier Interactive Patient Education  2018 [employer]  

## 2017-10-01 NOTE — Progress Notes (Signed)
Subjective:     Erika [name] is a 36 y.o. female who presents to the clinic 4 weeks status post NovaSure ablation for abnormal uterine bleeding. Eating a regular diet without difficulty. Bowel movements are normal. The patient is not having any pain.  The following portions of the patient's history were reviewed and updated as appropriate: allergies, current medications, past family history, past medical history, past social history, past surgical history and problem list.  Review of Systems Pertinent items are noted in HPI.    Objective:    There were no vitals taken for this visit. General:  alert, cooperative and no distress  Abdomen: no abnormal masses  Incision:   none     Assessment:    Doing well postoperatively. Operative findings again reviewed. Pathology report discussed.    Plan:    1. Continue any current medications. 2. Wound care discussed. 3. Activity restrictions: none 4. Anticipated return to work: not applicable. 5. Follow up: as needed, pap and mammogram are up to date  Woodroe Mode, MD 10/01/2017

## 2017-10-10 ENCOUNTER — Ambulatory Visit (INDEPENDENT_AMBULATORY_CARE_PROVIDER_SITE_OTHER): Payer: 59 | Admitting: Clinical

## 2017-10-10 DIAGNOSIS — F3340 Major depressive disorder, recurrent, in remission, unspecified: Secondary | ICD-10-CM | POA: Diagnosis not present

## 2017-10-13 ENCOUNTER — Telehealth: Payer: Self-pay

## 2017-10-13 DIAGNOSIS — N760 Acute vaginitis: Principal | ICD-10-CM

## 2017-10-13 DIAGNOSIS — B9689 Other specified bacterial agents as the cause of diseases classified elsewhere: Secondary | ICD-10-CM

## 2017-10-13 MED ORDER — METRONIDAZOLE 500 MG PO TABS: 500.0000 mg | ORAL_TABLET | Freq: Two times a day (BID) | ORAL | 0 refills | Status: AC

## 2017-10-13 NOTE — Telephone Encounter (Signed)
Pt called and left a message with Junious Silk stating that she is having vaginal odor, and discharge. Pt requesting rx to be sent to the pharmacy. Rs sent per protocol.

## 2017-10-22 ENCOUNTER — Ambulatory Visit: Payer: Self-pay | Admitting: Clinical

## 2017-12-24 ENCOUNTER — Telehealth: Payer: Self-pay | Admitting: Obstetrics

## 2018-02-09 ENCOUNTER — Encounter: Payer: Self-pay | Admitting: *Deleted

## 2018-02-16 ENCOUNTER — Encounter: Payer: Self-pay | Admitting: *Deleted

## 2018-03-04 ENCOUNTER — Ambulatory Visit (INDEPENDENT_AMBULATORY_CARE_PROVIDER_SITE_OTHER): Payer: 59 | Admitting: Obstetrics

## 2018-03-04 ENCOUNTER — Encounter: Payer: Self-pay | Admitting: Obstetrics

## 2018-03-04 VITALS — BP 125/80 | HR 81 | Temp 98.5°F | Resp 16 | Wt 125.0 lb

## 2018-03-04 DIAGNOSIS — Z124 Encounter for screening for malignant neoplasm of cervix: Secondary | ICD-10-CM

## 2018-03-04 DIAGNOSIS — N898 Other specified noninflammatory disorders of vagina: Secondary | ICD-10-CM

## 2018-03-04 DIAGNOSIS — Z1151 Encounter for screening for human papillomavirus (HPV): Secondary | ICD-10-CM | POA: Diagnosis not present

## 2018-03-04 DIAGNOSIS — Z01419 Encounter for gynecological examination (general) (routine) without abnormal findings: Secondary | ICD-10-CM

## 2018-03-04 DIAGNOSIS — N760 Acute vaginitis: Secondary | ICD-10-CM | POA: Diagnosis not present

## 2018-03-04 DIAGNOSIS — Z113 Encounter for screening for infections with a predominantly sexual mode of transmission: Secondary | ICD-10-CM | POA: Diagnosis not present

## 2018-03-04 DIAGNOSIS — Z1501 Genetic susceptibility to malignant neoplasm of breast: Secondary | ICD-10-CM

## 2018-03-04 NOTE — Progress Notes (Signed)
Subjective:        Erika [name] is a 37 y.o. female here for a routine exam.  Current complaints: none.  Requests BRCA Testing because of family history of Breast CA in mother and sister.   Personal health questionnaire:  Is patient [name], have a family history of breast and/or ovarian cancer: yes Is there a family history of uterine cancer diagnosed at age < 34, gastrointestinal cancer, urinary tract cancer, family member who is a Field seismologist syndrome-associated carrier: no Is the patient overweight and hypertensive, family history of diabetes, personal history of gestational diabetes, preeclampsia or PCOS: no Is patient over 62, have PCOS,  family history of premature CHD under age 76, diabetes, smoke, have hypertension or peripheral artery disease:  no At any time, has a partner hit, kicked or otherwise hurt or frightened you?: no Over the past 2 weeks, have you felt down, depressed or hopeless?: no Over the past 2 weeks, have you felt little interest or pleasure in doing things?:no   Gynecologic History No LMP recorded. Patient has had an ablation. Contraception: tubal ligation Last Pap: 2017. Results were: normal Last mammogram: 2018. Results were: normal  Obstetric History OB History  Gravida Para Term Preterm AB Living  '3 2 2   1 2  '$ SAB TAB Ectopic Multiple Live Births  1       2    # Outcome Date GA Lbr Len/2nd Weight Sex Delivery Anes PTL Lv  3 Term 07/06/03 [redacted]w[redacted]d 6 lb 8 oz (2.948 kg) M Vag-Spont None  LIV  2 Term 09/26/98 [id number] lb 4 oz (2.835 kg) F Vag-Spont None  LIV  1 SAB             Past Medical History:  Diagnosis Date  . Anemia   . Menorrhagia 07/2017    Past Surgical History:  Procedure Laterality Date  . ENDOMETRIAL ABLATION N/A 08/27/2017   Procedure: ENDOMETRIAL ABLATION WITH NOVASURE;  Surgeon: ArWoodroe ModeMD;  [place] Service: Gynecology;  Laterality: N/A;  . IUD REMOVAL  09/07/2007  . LEEP   04/22/2008  . TUBAL LIGATION  09/07/2007     Current Outpatient Medications:  .  acetaminophen (TYLENOL) 500 MG tablet, Take 1,000 mg by mouth every 6 (six) hours as needed., Disp: , Rfl:  .  ibuprofen (ADVIL,MOTRIN) 800 MG tablet, Take 1 tablet (800 mg total) by mouth every 8 (eight) hours as needed for mild pain, moderate pain or cramping., Disp: 30 tablet, Rfl: 5 Allergies  Allergen Reactions  . Latex Itching    Social History   Tobacco Use  . Smoking status: Former Smoker    Packs/day: 0.00    Last attempt to quit: 11/25/2015    Years since quitting: 2.2  . Smokeless tobacco: Never Used  Substance Use Topics  . Alcohol use: No    Alcohol/week: 0.0 oz    Family History  Problem Relation Age of Onset  . Cancer Mother   . Breast cancer Mother   . Diabetes Father   . Cancer Sister   . Breast cancer Sister       Review of Systems  Constitutional: negative for fatigue and weight loss Respiratory: negative for cough and wheezing Cardiovascular: negative for chest pain, fatigue and palpitations Gastrointestinal: negative for abdominal pain and change in bowel habits Musculoskeletal:negative for myalgias Neurological: negative for gait problems and tremors Behavioral/Psych: negative for abusive relationship, depression Endocrine: negative for temperature  intolerance    Genitourinary:negative for abnormal menstrual periods, genital lesions, hot flashes, sexual problems and vaginal discharge Integument/breast: negative for breast lump, breast tenderness, nipple discharge and skin lesion(s)    Objective:       BP 125/80 (BP Location: Right Arm, Patient Position: Sitting, Cuff Size: Small)   Pulse 81   Temp 98.5 F (36.9 C) (Oral)   Resp 16   Wt 125 lb (56.7 kg)   BMI 21.46 kg/m  General:   alert  Skin:   no rash or abnormalities  Lungs:   clear to auscultation bilaterally  Heart:   regular rate and rhythm, S1, S2 normal, no murmur, click, rub or gallop  Breasts:    normal without suspicious masses, skin or nipple changes or axillary nodes  Abdomen:  normal findings: no organomegaly, soft, non-tender and no hernia  Pelvis:  External genitalia: normal general appearance Urinary system: urethral meatus normal and bladder without fullness, nontender Vaginal: normal without tenderness, induration or masses Cervix: normal appearance Adnexa: normal bimanual exam Uterus: anteverted and non-tender, normal size   Lab Review Urine pregnancy test Labs reviewed yes Radiologic studies reviewed yes  50% of 20 min visit spent on counseling and coordination of care.   Assessment:     1. Encounter for gynecological examination with Papanicolaou smear of cervix Rx: - Cytology - PAP  2. Breast cancer genetic susceptibility ( mother and sister with Breast CA ) Rx: - BRCAssure Comprehensive Test  3. Vaginal discharge Rx: - Cervicovaginal ancillary only   Plan:    Education reviewed: calcium supplements, depression evaluation, low fat, low cholesterol diet, safe sex/STD prevention, self breast exams and weight bearing exercise. Follow up in: 1 year.   No orders of the defined types were placed in this encounter.  Orders Placed This Encounter  Procedures  . BRCAssure Comprehensive Test    Standing Status:   Future    Number of Occurrences:   1    Standing Expiration Date:   03/05/2019    Shelly Bombard MD 03-04-2018

## 2018-03-05 ENCOUNTER — Other Ambulatory Visit: Payer: Self-pay | Admitting: Obstetrics

## 2018-03-05 DIAGNOSIS — B3731 Acute candidiasis of vulva and vagina: Secondary | ICD-10-CM

## 2018-03-05 DIAGNOSIS — B373 Candidiasis of vulva and vagina: Secondary | ICD-10-CM

## 2018-03-05 LAB — CERVICOVAGINAL ANCILLARY ONLY
Bacterial vaginitis: POSITIVE — AB
Candida vaginitis: NEGATIVE
Chlamydia: NEGATIVE
Neisseria Gonorrhea: NEGATIVE
Trichomonas: NEGATIVE

## 2018-03-05 MED ORDER — FLUCONAZOLE 150 MG PO TABS: 150.0000 mg | ORAL_TABLET | Freq: Once | ORAL | 0 refills | Status: AC

## 2018-03-06 ENCOUNTER — Other Ambulatory Visit: Payer: Self-pay

## 2018-03-06 ENCOUNTER — Other Ambulatory Visit: Payer: Self-pay | Admitting: Obstetrics

## 2018-03-06 LAB — CYTOLOGY - PAP
Diagnosis: NEGATIVE
HPV: NOT DETECTED

## 2018-03-06 MED ORDER — METRONIDAZOLE 500 MG PO TABS: 500.0000 mg | ORAL_TABLET | Freq: Two times a day (BID) | ORAL | 0 refills | Status: AC

## 2018-03-24 LAB — COMPREHENSIVE BRCA1/2 ANALYSIS

## 2018-03-24 LAB — BRCASSURE COMPREHENSIVE TEST

## 2018-04-09 ENCOUNTER — Encounter: Payer: Self-pay | Admitting: *Deleted

## 2018-05-13 ENCOUNTER — Ambulatory Visit (INDEPENDENT_AMBULATORY_CARE_PROVIDER_SITE_OTHER): Payer: 59 | Admitting: Clinical

## 2018-05-13 DIAGNOSIS — F331 Major depressive disorder, recurrent, moderate: Secondary | ICD-10-CM | POA: Diagnosis not present

## 2018-05-29 ENCOUNTER — Encounter: Payer: Self-pay | Admitting: Neurology

## 2018-06-11 ENCOUNTER — Ambulatory Visit (INDEPENDENT_AMBULATORY_CARE_PROVIDER_SITE_OTHER): Payer: 59 | Admitting: Clinical

## 2018-06-11 DIAGNOSIS — F331 Major depressive disorder, recurrent, moderate: Secondary | ICD-10-CM

## 2018-06-25 ENCOUNTER — Ambulatory Visit (INDEPENDENT_AMBULATORY_CARE_PROVIDER_SITE_OTHER): Payer: 59 | Admitting: Clinical

## 2018-06-25 DIAGNOSIS — F331 Major depressive disorder, recurrent, moderate: Secondary | ICD-10-CM | POA: Diagnosis not present

## 2018-07-09 ENCOUNTER — Ambulatory Visit (INDEPENDENT_AMBULATORY_CARE_PROVIDER_SITE_OTHER): Payer: 59 | Admitting: Clinical

## 2018-07-09 DIAGNOSIS — F331 Major depressive disorder, recurrent, moderate: Secondary | ICD-10-CM | POA: Diagnosis not present

## 2018-07-16 ENCOUNTER — Ambulatory Visit (INDEPENDENT_AMBULATORY_CARE_PROVIDER_SITE_OTHER): Payer: 59 | Admitting: Clinical

## 2018-07-16 DIAGNOSIS — F331 Major depressive disorder, recurrent, moderate: Secondary | ICD-10-CM

## 2018-07-24 ENCOUNTER — Ambulatory Visit (INDEPENDENT_AMBULATORY_CARE_PROVIDER_SITE_OTHER): Payer: 59 | Admitting: Clinical

## 2018-07-24 DIAGNOSIS — F331 Major depressive disorder, recurrent, moderate: Secondary | ICD-10-CM

## 2018-07-28 ENCOUNTER — Encounter (HOSPITAL_COMMUNITY): Payer: Self-pay

## 2018-07-28 ENCOUNTER — Inpatient Hospital Stay (HOSPITAL_COMMUNITY): Payer: 59

## 2018-07-28 ENCOUNTER — Inpatient Hospital Stay (HOSPITAL_COMMUNITY)
Admission: AD | Admit: 2018-07-28 | Discharge: 2018-07-28 | Disposition: A | Discharge status: Home / Self Care | Payer: 59 | Source: Ambulatory Visit | Attending: Obstetrics and Gynecology | Admitting: Obstetrics and Gynecology

## 2018-07-28 ENCOUNTER — Telehealth: Payer: Self-pay | Admitting: *Deleted

## 2018-07-28 ENCOUNTER — Telehealth: Payer: Self-pay

## 2018-07-28 ENCOUNTER — Other Ambulatory Visit: Payer: Self-pay

## 2018-07-28 DIAGNOSIS — N898 Other specified noninflammatory disorders of vagina: Secondary | ICD-10-CM | POA: Insufficient documentation

## 2018-07-28 DIAGNOSIS — R1032 Left lower quadrant pain: Secondary | ICD-10-CM | POA: Insufficient documentation

## 2018-07-28 DIAGNOSIS — N83202 Unspecified ovarian cyst, left side: Secondary | ICD-10-CM | POA: Diagnosis not present

## 2018-07-28 DIAGNOSIS — Z87891 Personal history of nicotine dependence: Secondary | ICD-10-CM | POA: Diagnosis not present

## 2018-07-28 LAB — URINALYSIS, ROUTINE W REFLEX MICROSCOPIC
Bilirubin Urine: NEGATIVE
Glucose, UA: NEGATIVE mg/dL
Ketones, ur: NEGATIVE mg/dL
Leukocytes, UA: NEGATIVE
Nitrite: NEGATIVE
Protein, ur: NEGATIVE mg/dL
Specific Gravity, Urine: 1.021 (ref 1.005–1.030)
pH: 5 (ref 5.0–8.0)

## 2018-07-28 LAB — WET PREP, GENITAL
Clue Cells Wet Prep HPF POC: NONE SEEN
Sperm: NONE SEEN
Trich, Wet Prep: NONE SEEN
Yeast Wet Prep HPF POC: NONE SEEN

## 2018-07-28 LAB — POCT PREGNANCY, URINE: Preg Test, Ur: NEGATIVE

## 2018-07-28 MED ORDER — MEDROXYPROGESTERONE ACETATE 5 MG PO TABS: 5.0000 mg | ORAL_TABLET | Freq: Every day | ORAL | 0 refills | Status: DC

## 2018-07-28 MED ORDER — KETOROLAC TROMETHAMINE 10 MG PO TABS
10.0000 mg | ORAL_TABLET | Freq: Once | ORAL | Status: AC
  Administered 2018-07-28: 10 mg via ORAL
  Filled 2018-07-28: qty 1

## 2018-07-28 MED ORDER — KETOROLAC TROMETHAMINE 10 MG PO TABS: 10.0000 mg | ORAL_TABLET | Freq: Four times a day (QID) | ORAL | 0 refills | Status: DC | PRN

## 2018-07-28 NOTE — MAU Note (Signed)
Pt states that she had an ablation in October 2018. She state that she is not suppose to have any abdominal cramping, bleeding, or anything pertaining to a monthly cycle.   She states that she started having bright red vaginal bleeding Sunday that is now a chocolate colored discharge.   Pt states that 3 weeks ago she had severe pain in her left lower abdomen that stopped, but started up again last Thursday.

## 2018-07-28 NOTE — Telephone Encounter (Signed)
She needs to go to [facility].  She may have something that needs an ultrasound or further testing urgently.

## 2018-07-28 NOTE — Telephone Encounter (Signed)
Pt called in and scheduled appointment for next week patient states she is an extreme amount of pain wants to know if she can take anything other than the 800 mg of Ibuprofen because se is taking that every 4 hours and that is not how it was prescribed,please advise.Marland KitchenMarland Kitchen

## 2018-07-28 NOTE — Discharge Instructions (Signed)
Ovarian Cyst An ovarian cyst is a fluid-filled sac that forms on an ovary. The ovaries are small organs that produce eggs in women. Various types of cysts can form on the ovaries. Some may cause symptoms and require treatment. Most ovarian cysts go away on their own, are not cancerous (are benign), and do not cause problems. Common types of ovarian cysts include:  Functional (follicle) cysts. ? Occur during the menstrual cycle, and usually go away with the next menstrual cycle if you do not get pregnant. ? Usually cause no symptoms.  Endometriomas. ? Are cysts that form from the tissue that lines the uterus (endometrium). ? Are sometimes called "chocolate cysts" because they become filled with blood that turns brown. ? Can cause pain in the lower abdomen during intercourse and during your period.  Cystadenoma cysts. ? Develop from cells on the outside surface of the ovary. ? Can get very large and cause lower abdomen pain and pain with intercourse. ? Can cause severe pain if they twist or break open (rupture).  Dermoid cysts. ? Are sometimes found in both ovaries. ? May contain different kinds of body tissue, such as skin, teeth, hair, or cartilage. ? Usually do not cause symptoms unless they get very big.  Theca lutein cysts. ? Occur when too much of a certain hormone (human chorionic gonadotropin) is produced and overstimulates the ovaries to produce an egg. ? Are most common after having procedures used to assist with the conception of a baby (in vitro fertilization).  What are the causes? Ovarian cysts may be caused by:  Ovarian hyperstimulation syndrome. This is a condition that can develop from taking fertility medicines. It causes multiple large ovarian cysts to form.  Polycystic ovarian syndrome (PCOS). This is a common hormonal disorder that can cause ovarian cysts, as well as problems with your period or fertility.  What increases the risk? The following factors may make  you more likely to develop ovarian cysts:  Being overweight or obese.  Taking fertility medicines.  Taking certain forms of hormonal birth control.  Smoking.  What are the signs or symptoms? Many ovarian cysts do not cause symptoms. If symptoms are present, they may include:  Pelvic pain or pressure.  Pain in the lower abdomen.  Pain during sex.  Abdominal swelling.  Abnormal menstrual periods.  Increasing pain with menstrual periods.  How is this diagnosed? These cysts are commonly found during a routine pelvic exam. You may have tests to find out more about the cyst, such as:  Ultrasound.  X-ray of the pelvis.  CT scan.  MRI.  Blood tests.  How is this treated? Many ovarian cysts go away on their own without treatment. Your health care provider may want to check your cyst regularly for 2-3 months to see if it changes. If you are in menopause, it is especially important to have your cyst monitored closely because menopausal women have a higher rate of ovarian cancer. When treatment is needed, it may include:  Medicines to help relieve pain.  A procedure to drain the cyst (aspiration).  Surgery to remove the whole cyst.  Hormone treatment or birth control pills. These methods are sometimes used to help dissolve a cyst.  Follow these instructions at home:  Take over-the-counter and prescription medicines only as told by your health care provider.  Do not drive or use heavy machinery while taking prescription pain medicine.  Get regular pelvic exams and Pap tests as often as told by your health care   provider.  Return to your normal activities as told by your health care provider. Ask your health care provider what activities are safe for you.  Do not use any products that contain nicotine or tobacco, such as cigarettes and e-cigarettes. If you need help quitting, ask your health care provider.  Keep all follow-up visits as told by your health care provider.  This is important. Contact a health care provider if:  Your periods are late, irregular, or painful, or they stop.  You have pelvic pain that does not go away.  You have pressure on your bladder or trouble emptying your bladder completely.  You have pain during sex.  You have any of the following in your abdomen: ? A feeling of fullness. ? Pressure. ? Discomfort. ? Pain that does not go away. ? Swelling.  You feel generally ill.  You become constipated.  You lose your appetite.  You develop severe acne.  You start to have more body hair and facial hair.  You are gaining weight or losing weight without changing your exercise and eating habits.  You think you may be pregnant. Get help right away if:  You have abdominal pain that is severe or gets worse.  You cannot eat or drink without vomiting.  You suddenly develop a fever.  Your menstrual period is much heavier than usual. This information is not intended to replace advice given to you by your health care provider. Make sure you discuss any questions you have with your health care provider. Document Released: 11/11/2005 Document Revised: 05/31/2016 Document Reviewed: 04/14/2016 Elsevier Interactive Patient Education  2018 Elsevier Inc.  

## 2018-07-28 NOTE — Telephone Encounter (Signed)
Called pt and let her know that provider advised that she be seen at [facility], pt agreed.

## 2018-07-28 NOTE — MAU Provider Note (Addendum)
History     CSN: [id number]  Arrival date and time: 07/28/18 1500   First Provider Initiated Contact with Patient 07/28/18 1539      Chief Complaint  Patient presents with  . Abdominal Pain  . Vaginal Discharge   HPI  JHANAE [name] is a 37 y.o. [id number] non pregnant patient who presents to MAU with chief complaint of LLQ and vaginal bleeding.  LLQ pain This is a new problem, onset three weeks ago, subsided with Ibuprofen, then recurred Sunday 07/26/18. Pain is LLQ, 9/10, "throbbing", does not radiate, aggravated by standing for long periods of time. Previously alleviated by Ibuprofen, now denies alleviating factors.  Vaginal bleeding This is a new problem, coinciding with LLQ pain. Onset three weeks ago, subsided, then occurred again Sunday 09/010/19. Patient is s/p BTL in 2008 and is s/p endometrial ablation 08/27/2017. States she has not bled at all since her ablation and reports an uncomplicated recovery.   Denies intercourse, fever, recent illness, fall, other health concerns.  Pertinent Gynecological History: Menses: N/A after ablation 08/2017 Bleeding: dysfunctional uterine bleeding Contraception: tubal ligation DES exposure: denies Blood transfusions: none Sexually transmitted diseases: no past history Previous GYN Procedures: N/A  Last mammogram: normal Date: 07/25/2017 Last pap: normal Date: 02/2018   Past Medical History:  Diagnosis Date  . Anemia   . Menorrhagia 07/2017    Past Surgical History:  Procedure Laterality Date  . ENDOMETRIAL ABLATION N/A 08/27/2017   Procedure: ENDOMETRIAL ABLATION WITH NOVASURE;  Surgeon: Woodroe Mode, MD;  Location: [place];  Service: Gynecology;  Laterality: N/A;  . IUD REMOVAL  09/07/2007  . LEEP  04/22/2008  . TUBAL LIGATION  09/07/2007    Family History  Problem Relation Age of Onset  . Cancer Mother   . Breast cancer Mother   . Diabetes Father   . Cancer Sister   . Breast cancer Sister      Social History   Tobacco Use  . Smoking status: Former Smoker    Packs/day: 0.00    Last attempt to quit: 11/25/2015    Years since quitting: 2.6  . Smokeless tobacco: Never Used  Substance Use Topics  . Alcohol use: No    Alcohol/week: 0.0 standard drinks  . Drug use: No    Allergies:  Allergies  Allergen Reactions  . Latex Itching    Medications Prior to Admission  Medication Sig Dispense Refill Last Dose  . ibuprofen (ADVIL,MOTRIN) 800 MG tablet Take 1 tablet (800 mg total) by mouth every 8 (eight) hours as needed for mild pain, moderate pain or cramping. 30 tablet 5 07/28/2018 at Unknown time  . acetaminophen (TYLENOL) 500 MG tablet Take 1,000 mg by mouth every 6 (six) hours as needed.   Taking    Review of Systems  Constitutional: Negative for fatigue.  Respiratory: Negative for shortness of breath.   Gastrointestinal: Negative for nausea and vomiting.  Genitourinary: Positive for vaginal bleeding.  Neurological: Negative for dizziness and syncope.  All other systems reviewed and are negative.  Physical Exam   Blood pressure 126/68, pulse 97, temperature 98.4 F (36.9 C), temperature source Oral, resp. rate 16, weight 54.7 kg, SpO2 100 %.  Physical Exam  Nursing note and vitals reviewed. Constitutional: She is oriented to person, place, and time. She appears well-developed and well-nourished.  Cardiovascular: Normal rate and regular rhythm.  Respiratory: Effort normal. No respiratory distress.  GI: Soft. She exhibits no distension. There is no tenderness. There is no rebound.  Genitourinary: Vagina normal and uterus normal.  Genitourinary Comments: Scant pale brown discharge visible on swab collection  Neurological: She is alert and oriented to person, place, and time. She has normal reflexes.  Skin: Skin is warm and dry.  Psychiatric: She has a normal mood and affect. Her behavior is normal. Judgment and thought content normal.    MAU Course   Procedures  MDM --Pain responsive to Toradol in MAU --Scant bleeding. Previous success with Provera. Discussed that with such light bleeding Provera may not have significant difference.Pt continues to request --See scanned [place] report for myometrial cysts, left ovary  Patient Vitals for the past 24 hrs:  BP Temp Temp src Pulse Resp SpO2 Weight  07/28/18 1514 126/68 98.4 F (36.9 C) Oral 97 16 100 % -  07/28/18 1509 - - - - - - 54.7 kg    Orders Placed This Encounter  Procedures  . Wet prep, genital  . US PELVIC COMPLETE WITH TRANSVAGINAL  . Urinalysis, Routine w reflex microscopic  . Pregnancy, urine POC  . Discharge patient   Results for orders placed or performed during the hospital encounter of 07/28/18 (from the past 24 hour(s))  Urinalysis, Routine w reflex microscopic     Status: Abnormal   Collection Time: 07/28/18  3:26 PM  Result Value Ref Range   Color, Urine YELLOW YELLOW   APPearance HAZY (A) CLEAR   Specific Gravity, Urine 1.021 1.005 - 1.030   pH 5.0 5.0 - 8.0   Glucose, UA NEGATIVE NEGATIVE mg/dL   Hgb urine dipstick SMALL (A) NEGATIVE   Bilirubin Urine NEGATIVE NEGATIVE   Ketones, ur NEGATIVE NEGATIVE mg/dL   Protein, ur NEGATIVE NEGATIVE mg/dL   Nitrite NEGATIVE NEGATIVE   Leukocytes, UA NEGATIVE NEGATIVE   RBC / HPF 0-5 0 - 5 RBC/hpf   WBC, UA 11-20 0 - 5 WBC/hpf   Bacteria, UA RARE (A) NONE SEEN   Squamous Epithelial / LPF 0-5 0 - 5   Mucus PRESENT   Pregnancy, urine POC     Status: None   Collection Time: 07/28/18  3:29 PM  Result Value Ref Range   Preg Test, Ur NEGATIVE NEGATIVE  Wet prep, genital     Status: Abnormal   Collection Time: 07/28/18  3:44 PM  Result Value Ref Range   Yeast Wet Prep HPF POC NONE SEEN NONE SEEN   Trich, Wet Prep NONE SEEN NONE SEEN   Clue Cells Wet Prep HPF POC NONE SEEN NONE SEEN   WBC, Wet Prep HPF POC FEW (A) NONE SEEN   Sperm NONE SEEN    Meds ordered this encounter  Medications  . ketorolac (TORADOL) tablet  10 mg  . medroxyPROGESTERone (PROVERA) 5 MG tablet    Sig: Take 1 tablet (5 mg total) by mouth daily for 14 days.    Dispense:  14 tablet    Refill:  0    Order Specific Question:   Supervising Provider    Answer:   Donnamae Jude [[id number]]  . ketorolac (TORADOL) 10 MG tablet    Sig: Take 1 tablet (10 mg total) by mouth every 6 (six) hours as needed.    Dispense:  20 tablet    Refill:  0    Order Specific Question:   Supervising Provider    Answer:   Merrily Pew   US Pelvic Complete With Transvaginal  Result Date: 07/28/2018 CLINICAL DATA:  Left lower quadrant pain and vaginal bleeding. Approximately 1 year status  post endometrial ablation. EXAM: TRANSABDOMINAL AND TRANSVAGINAL ULTRASOUND OF PELVIS TECHNIQUE: Both transabdominal and transvaginal ultrasound examinations of the pelvis were performed. Transabdominal technique was performed for global imaging of the pelvis including uterus, ovaries, adnexal regions, and pelvic cul-de-sac. It was necessary to proceed with endovaginal exam following the transabdominal exam to visualize the endometrium and ovaries. COMPARISON:  07/30/2017 FINDINGS: Uterus Measurements: 7.7 x 4.7 x 5.2 cm. Heterogeneous myometrial echotexture, without evidence of discrete fibroids. Endometrium Thickness: Poorly defined endometrial-myometrial junction, consistent with previous endometrial ablation. No evidence of hydrometros. Focal rounded area of hyperechogenicity involving the endometrium and adjacent myometrium in the upper uterine corpus measures approximately 1.8 cm. This could represent adenomyosis although endometrial polyps or carcinoma cannot be excluded. Right ovary Measurements: 3.4 x 1.8 x 2.5 cm. Normal appearance/no adnexal mass. Left ovary Measurements: 3.0 x 1.7 x 2.3 cm. Normal appearance/no adnexal mass. Other findings No abnormal free fluid. IMPRESSION: Findings consistent with previous endometrial ablation. No evidence of hydrometros. Focal rounded  area of hyperechogenicity involving the endometrium and adjacent myometrium in the upper corpus. This is suspicious for uterine adenomyosis, although endometrial polyps or carcinoma cannot be excluded. Endometrial sampling should be considered. Pelvic MRI without and with contrast could also be performed for further evaluation. Normal appearance of both ovaries.  No adnexal mass identified. Electronically Signed   By: Earle Gell M.D.   On: 07/28/2018 17:35    Assessment and Plan  --37 y.o. [id number] non pregnant patient, s/p BTL 2008, endometrial ablation 08/2017 --Abnormal uterine bleeding and LLQ pain --Possible adenomyosis per [place] --Rx Provera requested by patient, Toradol for pain PRN --Discharge home in stable condition  F/U: Patient scheduled for f/u at [facility] Thursday 08/06/18  Darlina Rumpf, CNM 07/28/2018, 5:23 PM

## 2018-07-30 NOTE — Progress Notes (Signed)
NEUROLOGY CONSULTATION NOTE  [name] MRN: [id number] DOB: 1981/03/05  Referring provider: Laroy Apple  Primary care provider: No PCP  Reason for consult:  headache  HISTORY OF PRESENT ILLNESS: Erika [name] is a 37 year old female who presents for headache.   Onset:  Late 20s, worse over past 6 months. Location:  Bifrontal, sometimes suboccipital region Quality:  stabbing Intensity:  Severe.  She denies new headache, thunderclap headache or severe headache that wakes her from sleep. Aura:  no Prodrome:  no Postdrome:  no Associated symptoms:  Nausea, vomiting, photophobia, phonophobia, osmophobia.  She denies associated visual disturbance or unilateral numbness or weakness. Duration:  All day Frequency:  Inconsistent.  It occurs 3 to 4 days to a week with 2 day break in between Frequency of abortive medication: no Triggers:  Strong perfume Exacerbating factors:  Lights, strong perfume, computer screen Relieving factors:  Sleep in dark quiet room Activity:  Aggravates.  On average, she misses 5 to 10 days (either 1/2 or entire day)  Current NSAIDS:  Ketorolac 10mg  prn (not for headache) Current analgesics:  no Current triptans:  no Current ergotamine:  no Current anti-emetic:  no Current muscle relaxants:  no Current anti-anxiolytic:  no Current sleep aide:  no Current Antihypertensive medications:  no Current Antidepressant medications:  no Current Anticonvulsant medications:  no Current anti-CGRP:  no Current Vitamins/Herbal/Supplements:  no Current Antihistamines/Decongestants:  no Other therapy:  no Hormone/birth control:  Provera  Past NSAIDS:  ibuprofen Past analgesics:  Tylenol, Goody powder, Excedrin Past abortive triptans:  no Past abortive ergotamine:  no Past muscle relaxants:  no Past anti-emetic:  no Past antihypertensive medications:  no Past antidepressant medications:  no Past anticonvulsant medications:  no Past anti-CGRP:   no Past vitamins/Herbal/Supplements:  no Past antihistamines/decongestants:  no Other past therapies:  no  Caffeine:  1 16 oz cup coffee daily Alcohol:  no Smoker:  no Diet:  Less than 8 glasses water daily, skips meals Exercise:  Not routine Depression:  yes; Anxiety:  Yes.  Sees a therapist. Other pain:  cramps Sleep hygiene:  Usually good. 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 Family history of headache:  Dad Family history of cerebral aneurysm:  Dad (ruptured), paternal grandmother, paternal cousin, paternal aunt.  PAST MEDICAL HISTORY: Past Medical History:  Diagnosis Date  . Anemia   . Menorrhagia 07/2017    PAST SURGICAL HISTORY: Past Surgical History:  Procedure Laterality Date  . ENDOMETRIAL ABLATION N/A 08/27/2017   Procedure: ENDOMETRIAL ABLATION WITH NOVASURE;  Surgeon: Woodroe Mode, MD;  Location: [facility];  Service: Gynecology;  Laterality: N/A;  . IUD REMOVAL  09/07/2007  . LEEP  04/22/2008  . TUBAL LIGATION  09/07/2007    MEDICATIONS: Current Outpatient Medications on File Prior to Visit  Medication Sig Dispense Refill  . acetaminophen (TYLENOL) 500 MG tablet Take 1,000 mg by mouth every 6 (six) hours as needed.    Marland Kitchen ketorolac (TORADOL) 10 MG tablet Take 1 tablet (10 mg total) by mouth every 6 (six) hours as needed. 20 tablet 0  . medroxyPROGESTERone (PROVERA) 5 MG tablet Take 1 tablet (5 mg total) by mouth daily for 14 days. 14 tablet 0   No current facility-administered medications on file prior to visit.     ALLERGIES: Allergies  Allergen Reactions  . Latex Itching    FAMILY HISTORY: Family History  Problem Relation Age of Onset  .  Cancer Mother   . Breast cancer Mother   . Diabetes Father   . Cancer Sister   . Breast cancer Sister    SOCIAL HISTORY: Social History   Socioeconomic History  . Marital status: Divorced    Spouse name: Not on file  . Number of children:  Not on file  . Years of education: Not on file  . Highest education level: Not on file  Occupational History  . Not on file  Social Needs  . Financial resource strain: Not on file  . Food insecurity:    Worry: Not on file    Inability: Not on file  . Transportation needs:    Medical: Not on file    Non-medical: Not on file  Tobacco Use  . Smoking status: Former Smoker    Packs/day: 0.00    Last attempt to quit: 11/25/2015    Years since quitting: 2.6  . Smokeless tobacco: Never Used  Substance and Sexual Activity  . Alcohol use: No    Alcohol/week: 0.0 standard drinks  . Drug use: No  . Sexual activity: Yes    Partners: Male    Birth control/protection: Surgical  Lifestyle  . Physical activity:    Days per week: Not on file    Minutes per session: Not on file  . Stress: Not on file  Relationships  . Social connections:    Talks on phone: Not on file    Gets together: Not on file    Attends religious service: Not on file    Active member of club or organization: Not on file    Attends meetings of clubs or organizations: Not on file    Relationship status: Not on file  . Intimate partner violence:    Fear of current or ex partner: Not on file    Emotionally abused: Not on file    Physically abused: Not on file    Forced sexual activity: Not on file  Other Topics Concern  . Not on file  Social History Narrative  . Not on file    REVIEW OF SYSTEMS: Constitutional: No fevers, chills, or sweats, no generalized fatigue, change in appetite Eyes: No visual changes, double vision, eye pain Ear, nose and throat: No hearing loss, ear pain, nasal congestion, sore throat Cardiovascular: No chest pain, palpitations Respiratory:  No shortness of breath at rest or with exertion, wheezes GastrointestinaI: No nausea, vomiting, diarrhea, abdominal pain, fecal incontinence Genitourinary:  No dysuria, urinary retention or frequency Musculoskeletal:  No neck pain, back  pain Integumentary: No rash, pruritus, skin lesions Neurological: as above Psychiatric: No depression, insomnia, anxiety Endocrine: No palpitations, fatigue, diaphoresis, mood swings, change in appetite, change in weight, increased thirst Hematologic/Lymphatic:  No purpura, petechiae. Allergic/Immunologic: no itchy/runny eyes, nasal congestion, recent allergic reactions, rashes  PHYSICAL EXAM: Blood pressure 128/68, pulse 81, height 5\' 4"  (1.626 m), weight 121 lb (54.9 kg), SpO2 99 %. General: No acute distress.  Patient appears well-groomed.   Head:  Normocephalic/atraumatic Eyes:  fundi examined but not visualized Neck: supple, suboccipital and upper paraspinal tenderness, full range of motion Back: no paraspinal tenderness Heart: regular rate and rhythm Lungs: Clear to auscultation bilaterally. Vascular: No carotid bruits. Neurological Exam: Mental status: alert and oriented to person, place, and time, recent and remote memory intact, fund of knowledge intact, attention and concentration intact, speech fluent and not dysarthric, language intact. Cranial nerves: CN I: not tested CN II: pupils equal, round and reactive to light, visual fields intact  CN III, IV, VI:  full range of motion, no nystagmus, no ptosis CN V: facial sensation intact CN VII: upper and lower face symmetric CN VIII: hearing intact CN IX, X: gag intact, uvula midline CN XI: sternocleidomastoid and trapezius muscles intact CN XII: tongue midline Bulk & Tone: normal, no fasciculations. Motor:  5/5 throughout  Sensation:  temperature and vibration sensation intact.   Deep Tendon Reflexes:  2+ throughout, toes downgoing.   Finger to nose testing:  Without dysmetria.  no Heel to shin:  Without dysmetria.  no Gait:  Normal station and stride.  Able to turn and tandem walk. Romberg negative  IMPRESSION: Chronic migraines without aura, without status migrainosus, intractable, worse over past 6 months Family  history of cerebral aneurysm Depression and anxiety   PLAN: 1.  Given worsening headaches and family history of cerebral aneurysm, will check MRI/MRA of brain without contrast. 2.  Start sertraline 50mg  daily for migraine prevention and treatment of depression and anxiety.  We can increase dose to 75mg  daily in 4 weeks if needed. 3.  Sumatriptan for abortive therapy 4.  Limit use of pain relievers to no more than 2 days out of week to prevent risk of rebound or medication-overuse headache. 5.  Stop caffeine, increase water intake, do not skip meals 6.  Consider magnesium citrate 400mg  daily, CoQ10 100mg  three times daily and riboflavin 400mg  daily 7.  Follow up in 4 months.  45 minutes spent face to face with patient, over 50% spent discussing diagnosis and management  Metta Clines, DO

## 2018-07-31 ENCOUNTER — Encounter: Payer: Self-pay | Admitting: Neurology

## 2018-07-31 ENCOUNTER — Ambulatory Visit (INDEPENDENT_AMBULATORY_CARE_PROVIDER_SITE_OTHER): Payer: 59 | Admitting: Neurology

## 2018-07-31 VITALS — BP 128/68 | HR 81 | Ht 64.0 in | Wt 121.0 lb

## 2018-07-31 DIAGNOSIS — F32A Depression, unspecified: Secondary | ICD-10-CM

## 2018-07-31 DIAGNOSIS — Z8249 Family history of ischemic heart disease and other diseases of the circulatory system: Secondary | ICD-10-CM

## 2018-07-31 DIAGNOSIS — G4452 New daily persistent headache (NDPH): Secondary | ICD-10-CM

## 2018-07-31 DIAGNOSIS — G43719 Chronic migraine without aura, intractable, without status migrainosus: Secondary | ICD-10-CM | POA: Diagnosis not present

## 2018-07-31 DIAGNOSIS — F419 Anxiety disorder, unspecified: Secondary | ICD-10-CM | POA: Diagnosis not present

## 2018-07-31 DIAGNOSIS — F329 Major depressive disorder, single episode, unspecified: Secondary | ICD-10-CM

## 2018-07-31 MED ORDER — SERTRALINE HCL 50 MG PO TABS: 50.0000 mg | ORAL_TABLET | Freq: Every day | ORAL | 3 refills | Status: DC

## 2018-07-31 MED ORDER — SUMATRIPTAN SUCCINATE 100 MG PO TABS: ORAL_TABLET | ORAL | 3 refills | Status: DC

## 2018-07-31 NOTE — Patient Instructions (Addendum)
Migraine Recommendations: 1.  Start sertraline 50mg  daily.  Contact us in 4 weeks with update and we can adjust dose if needed. 2.  Take sumatriptan 100mg  at earliest onset of headache.  May repeat dose once in 2 hours if needed.  Do not exceed two tablets in 24 hours. 3.  Limit use of pain relievers to no more than 2 days out of the week.  These medications include acetaminophen, ibuprofen, triptans and narcotics.  This will help reduce risk of rebound headaches. 4.  Be aware of common food triggers such as processed sweets, processed foods with nitrites (such as deli meat, hot dogs, sausages), foods with MSG, alcohol (such as wine), chocolate, certain cheeses, certain fruits (dried fruits, bananas, some citrus fruit), vinegar, diet soda. 4.  Avoid caffeine 5.  Routine exercise 6.  Proper sleep hygiene 7.  Stay adequately hydrated with water 8.  Keep a headache diary. 9.  Maintain proper stress management. 10.  Do not skip meals. 11.  Consider supplements:  Magnesium citrate 400mg  to 600mg  daily, riboflavin 400mg , Coenzyme Q 10 100mg  three times daily 12.  Check MRI and MRA of head 13.  Follow up in 4 months.

## 2018-08-04 ENCOUNTER — Ambulatory Visit (INDEPENDENT_AMBULATORY_CARE_PROVIDER_SITE_OTHER): Payer: 59 | Admitting: Clinical

## 2018-08-04 DIAGNOSIS — F331 Major depressive disorder, recurrent, moderate: Secondary | ICD-10-CM

## 2018-08-05 ENCOUNTER — Ambulatory Visit: Payer: 59 | Admitting: Clinical

## 2018-08-06 ENCOUNTER — Encounter: Payer: Self-pay | Admitting: Obstetrics

## 2018-08-06 ENCOUNTER — Ambulatory Visit (INDEPENDENT_AMBULATORY_CARE_PROVIDER_SITE_OTHER): Payer: 59 | Admitting: Obstetrics

## 2018-08-06 VITALS — BP 132/77 | HR 81 | Ht 64.0 in | Wt 120.9 lb

## 2018-08-06 DIAGNOSIS — N946 Dysmenorrhea, unspecified: Secondary | ICD-10-CM | POA: Diagnosis not present

## 2018-08-06 DIAGNOSIS — Z9889 Other specified postprocedural states: Secondary | ICD-10-CM

## 2018-08-06 DIAGNOSIS — N939 Abnormal uterine and vaginal bleeding, unspecified: Secondary | ICD-10-CM

## 2018-08-06 NOTE — Progress Notes (Signed)
Presents for AUB x 1 day 07/27/18.  Pelvic US done at [facility] 07/28/18- Cyst.  Said the pain stopped.

## 2018-08-06 NOTE — Progress Notes (Signed)
Patient ID: Erika [name], female   DOB: [birth year], 37 y.o.   MRN: [id number]  Chief Complaint  Patient presents with  . Gynecologic Exam    HPI CATTLEYA Erika [name] is a 37 y.o. female.  One day episode of vaginal bleeding and severe cramping.  Endometrial Ablation doe ~ 1 year ago. HPI  Past Medical History:  Diagnosis Date  . Anemia   . Menorrhagia 07/2017    Past Surgical History:  Procedure Laterality Date  . ENDOMETRIAL ABLATION N/A 08/27/2017   Procedure: ENDOMETRIAL ABLATION WITH NOVASURE;  Surgeon: Woodroe Mode, MD;  Location: [facility];  Service: Gynecology;  Laterality: N/A;  . IUD REMOVAL  09/07/2007  . LEEP  04/22/2008  . TUBAL LIGATION  09/07/2007    Family History  Problem Relation Age of Onset  . Cancer Mother   . Breast cancer Mother   . Diabetes Father   . Cancer Sister   . Breast cancer Sister   . Alzheimer's disease Paternal Grandmother   . Anuerysm Other        Paternal family Hx    Social History Social History   Tobacco Use  . Smoking status: Former Smoker    Packs/day: 0.00    Last attempt to quit: 11/25/2015    Years since quitting: 2.6  . Smokeless tobacco: Never Used  Substance Use Topics  . Alcohol use: No    Alcohol/week: 0.0 standard drinks  . Drug use: No    Allergies  Allergen Reactions  . Latex Itching    Current Outpatient Medications  Medication Sig Dispense Refill  . medroxyPROGESTERone (PROVERA) 5 MG tablet Take 1 tablet (5 mg total) by mouth daily for 14 days. 14 tablet 0  . sertraline (ZOLOFT) 50 MG tablet Take 1 tablet (50 mg total) by mouth daily. 30 tablet 3  . SUMAtriptan (IMITREX) 100 MG tablet Take 1 tablet earliest onset of migraine.  May repeat x1 in 2 hours if headache persists or recurs.  Do not exceed 2 tablets in 24h 10 tablet 3  . acetaminophen (TYLENOL) 500 MG tablet Take 1,000 mg by mouth every 6 (six) hours as needed.    Marland Kitchen ketorolac (TORADOL) 10 MG tablet Take 1 tablet (10 mg total) by  mouth every 6 (six) hours as needed. (Patient not taking: Reported on 08/06/2018) 20 tablet 0   No current facility-administered medications for this visit.     Review of Systems Review of Systems Constitutional: negative for fatigue and weight loss Respiratory: negative for cough and wheezing Cardiovascular: negative for chest pain, fatigue and palpitations Gastrointestinal: negative for abdominal pain and change in bowel habits Genitourinary:positive for one episode of vaginal bleeding and cramping Integument/breast: negative for nipple discharge Musculoskeletal:negative for myalgias Neurological: negative for gait problems and tremors Behavioral/Psych: negative for abusive relationship, depression Endocrine: negative for temperature intolerance      Blood pressure 132/77, pulse 81, height 5\' 4"  (1.626 m), weight 120 lb 14.4 oz (54.8 kg).  Physical Exam Physical Exam:  Deferred  >50% of 15 min visit spent on counseling and coordination of care.   Data Reviewed Ultrasound: US PELVIC COMPLETE WITH TRANSVAGINAL (Accession [id number]) (Order [id number])  Imaging  Date: 07/28/2018 Department: [facility] Released By/Authorizing: Darlina Rumpf, CNM (auto-released)  Exam Information   Status Exam Begun  Exam Ended   Final [99] 07/28/2018 4:15 PM 07/28/2018 4:56 PM  PACS Images   Show images for US PELVIC COMPLETE WITH TRANSVAGINAL  Study Result   CLINICAL DATA:  Left lower quadrant pain and vaginal bleeding. Approximately 1 year status post endometrial ablation.  EXAM: TRANSABDOMINAL AND TRANSVAGINAL ULTRASOUND OF PELVIS  TECHNIQUE: Both transabdominal and transvaginal ultrasound examinations of the pelvis were performed. Transabdominal technique was performed for global imaging of the pelvis including uterus, ovaries, adnexal regions, and pelvic cul-de-sac. It was necessary to proceed with endovaginal exam following the  transabdominal exam to visualize the endometrium and ovaries.  COMPARISON:  07/30/2017  FINDINGS: Uterus  Measurements: 7.7 x 4.7 x 5.2 cm. Heterogeneous myometrial echotexture, without evidence of discrete fibroids.  Endometrium  Thickness: Poorly defined endometrial-myometrial junction, consistent with previous endometrial ablation. No evidence of hydrometros. Focal rounded area of hyperechogenicity involving the endometrium and adjacent myometrium in the upper uterine corpus measures approximately 1.8 cm. This could represent adenomyosis although endometrial polyps or carcinoma cannot be excluded.  Right ovary  Measurements: 3.4 x 1.8 x 2.5 cm. Normal appearance/no adnexal mass.  Left ovary  Measurements: 3.0 x 1.7 x 2.3 cm. Normal appearance/no adnexal mass.  Other findings  No abnormal free fluid.  IMPRESSION: Findings consistent with previous endometrial ablation. No evidence of hydrometros.  Focal rounded area of hyperechogenicity involving the endometrium and adjacent myometrium in the upper corpus. This is suspicious for uterine adenomyosis, although endometrial polyps or carcinoma cannot be excluded. Endometrial sampling should be considered. Pelvic MRI without and with contrast could also be performed for further evaluation.  Normal appearance of both ovaries.  No adnexal mass identified.   Electronically Signed   By: Earle Gell M.D.   On: 07/28/2018 17:35      Assessment     1. Abnormal uterine bleeding (AUB) - L - will follow clinically  2. Dysmenorrhea - Ibuprofen prn  3. S/P endometrial ablation - [facility]     Plan    Follow up in 3 months  No orders of the defined types were placed in this encounter.  No orders of the defined types were placed in this encounter.   Shelly Bombard MD 08-06-2018

## 2018-08-13 ENCOUNTER — Ambulatory Visit: Payer: 59 | Admitting: Clinical

## 2018-08-14 DIAGNOSIS — Z0279 Encounter for issue of other medical certificate: Secondary | ICD-10-CM

## 2018-08-19 ENCOUNTER — Telehealth: Payer: Self-pay | Admitting: Neurology

## 2018-08-19 NOTE — Telephone Encounter (Signed)
Patient wants to check the status of the FMLA paperwork being sent off please call

## 2018-08-20 ENCOUNTER — Ambulatory Visit
Admission: RE | Admit: 2018-08-20 | Discharge: 2018-08-20 | Disposition: A | Discharge status: Home / Self Care | Payer: 59 | Source: Ambulatory Visit | Attending: Neurology | Admitting: Neurology

## 2018-08-20 DIAGNOSIS — G4452 New daily persistent headache (NDPH): Secondary | ICD-10-CM

## 2018-08-20 DIAGNOSIS — Z8249 Family history of ischemic heart disease and other diseases of the circulatory system: Secondary | ICD-10-CM

## 2018-08-21 NOTE — Telephone Encounter (Signed)
Patient is calling back on the FMLA paper work

## 2018-08-21 NOTE — Telephone Encounter (Signed)
Called and LMOVM advising Pt to call me back on Monday about mailing

## 2018-08-24 ENCOUNTER — Telehealth: Payer: Self-pay

## 2018-08-24 ENCOUNTER — Other Ambulatory Visit: Payer: Self-pay | Admitting: *Deleted

## 2018-08-24 ENCOUNTER — Telehealth: Payer: Self-pay | Admitting: *Deleted

## 2018-08-24 MED ORDER — SERTRALINE HCL 50 MG PO TABS: 75.0000 mg | ORAL_TABLET | Freq: Every day | ORAL | 3 refills | Status: DC

## 2018-08-24 NOTE — Telephone Encounter (Signed)
Pt returned my call concerning FMLA, gave fax # [phone] or [phone]. To mail original to Pt.

## 2018-08-24 NOTE — Telephone Encounter (Signed)
Increase sertraline to 75mg  daily.  We can increase dose in 4 weeks to 100mg  daily if needed.

## 2018-08-24 NOTE — Telephone Encounter (Signed)
-----   Message from Chester Holstein, LPN sent at 01/18/96  8:41 AM EDT -----   ----- Message ----- From: Pieter Partridge, DO Sent: 08/21/2018   8:30 AM EDT To: Clois Comber, CMA  MRI of brain looks normal.  There is a questionable tiny aneurysm seen on MRI.  It may just be artifact.  However, even if it is an aneurysm, it is tiny and not clinically significance with virtually no risk for rupture.

## 2018-08-24 NOTE — Telephone Encounter (Signed)
Patient given results.  She has been taking zoloft and is now having more migraines and is still waking up with headaches.

## 2018-08-24 NOTE — Telephone Encounter (Signed)
Patient instructed to take 75 mg daily.  New Rx sent in.

## 2018-09-04 ENCOUNTER — Telehealth: Payer: Self-pay | Admitting: Neurology

## 2018-09-04 NOTE — Telephone Encounter (Signed)
Patient is calling wanting to know why she has migraine flare ups on Mondays and Fridays. She said that its always the same days when it gets worse. Please call her back at [phone]. Thanks!

## 2018-09-07 NOTE — Telephone Encounter (Signed)
Called and spoke with Pt, advised ehr to give the increase in the Zoloft another 2-3 weeks, remember to hydrate, avoid caffeine, and try to get 7-8 hrs of sleep. Call us in 2-3 weeks if still bothersome

## 2018-10-28 ENCOUNTER — Telehealth: Payer: Self-pay | Admitting: Neurology

## 2018-10-28 NOTE — Telephone Encounter (Signed)
Try something other than Zoloft or increase dosage as she is still having a lot of headaches. Please call her back at [phone]. Thanks!

## 2018-10-29 ENCOUNTER — Encounter: Payer: Self-pay | Admitting: Neurology

## 2018-10-29 ENCOUNTER — Ambulatory Visit (INDEPENDENT_AMBULATORY_CARE_PROVIDER_SITE_OTHER): Payer: 59 | Admitting: Neurology

## 2018-10-29 ENCOUNTER — Other Ambulatory Visit (INDEPENDENT_AMBULATORY_CARE_PROVIDER_SITE_OTHER): Payer: 59

## 2018-10-29 VITALS — BP 98/68 | HR 71 | Ht 64.0 in | Wt 113.0 lb

## 2018-10-29 DIAGNOSIS — I671 Cerebral aneurysm, nonruptured: Secondary | ICD-10-CM | POA: Diagnosis not present

## 2018-10-29 DIAGNOSIS — G43709 Chronic migraine without aura, not intractable, without status migrainosus: Secondary | ICD-10-CM

## 2018-10-29 DIAGNOSIS — Z79899 Other long term (current) drug therapy: Secondary | ICD-10-CM

## 2018-10-29 LAB — BASIC METABOLIC PANEL
BUN: 7 mg/dL (ref 6–23)
CO2: 27 mEq/L (ref 19–32)
Calcium: 9.2 mg/dL (ref 8.4–10.5)
Chloride: 107 mEq/L (ref 96–112)
Creatinine, Ser: 0.64 mg/dL (ref 0.40–1.20)
GFR: 133.06 mL/min (ref >60.00)
Glucose, Bld: 93 mg/dL (ref 70–99)
Potassium: 4.6 mEq/L (ref 3.5–5.1)
Sodium: 139 mEq/L (ref 135–145)

## 2018-10-29 MED ORDER — FLURBIPROFEN 100 MG PO TABS: ORAL_TABLET | ORAL | 3 refills | Status: DC

## 2018-10-29 MED ORDER — TOPIRAMATE 50 MG PO TABS: 50.0000 mg | ORAL_TABLET | Freq: Every day | ORAL | 3 refills | Status: DC

## 2018-10-29 NOTE — Progress Notes (Signed)
NEUROLOGY FOLLOW UP OFFICE NOTE  [name] [id number]  HISTORY OF PRESENT ILLNESS: Erika [name] is a 37 year old female who follows up for migraines.  UPDATE: MRI of the brain without contrast from 08/20/2018 was personally reviewed and was normal.  MRA of the head showed questionable 2 mm focal outpouching at the left MCA bifurcation was seen, uncertain if motion artifact or tiny aneurysm.  Intensity:  Severe Duration:  Uncertain because would go to sleep.  But likely 2 hours. Frequency:  14 days in November Frequency of abortive medication: 2 days out of the week. Current NSAIDS: Ketorolac 10 mg as needed (not for headache) Current analgesics: None Current triptans: Sumatriptan 100 mg.  Makes her nauseous and drowsy. Current ergotamine: None Current anti-emetic: None Current muscle relaxants: None Current anti-anxiolytic: None Current sleep aide: None Current Antihypertensive medications: None Current Antidepressant medications: Sertraline 75 mg Current Anticonvulsant medications: None Current anti-CGRP: None Current Vitamins/Herbal/Supplements: None Current Antihistamines/Decongestants: None Other therapy: None Hormone/birth control: Provera  Caffeine: 1 16 ounce cup of decaf coffee daily Alcohol: None Smoker: No Diet: Increased water intake.   Exercise: Not routine Depression: Yes; Anxiety: Yes.  Sees a therapist. Other pain: Cramps Sleep hygiene: Usually good  HISTORY: Onset: Late 20s, worse over the past year. Location:  Bifrontal, sometimes suboccipital region Quality:  stabbing Initial intensity:  Severe.  She denies new headache, thunderclap headache or severe headache that wakes her from sleep. Aura:  no Prodrome:  no Postdrome:  no Associated symptoms: Nausea, vomiting, photophobia, phonophobia, osmophobia.  She denies associated visual disturbance or unilateral numbness or weakness. Initial duration:  All day Initial Frequency:  Inconsistent.   It occurs 3 to 4 days to a week with 2 day break in between Initial Frequency of abortive medication: no Triggers: Strong perfume Exacerbating factors:  Lights, strong perfume, computer screen Relieving factors:  Sleep in dark quiet room Activity:  Aggravates.  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 Family history of headache:  Dad Family history of cerebral aneurysm:  Dad (ruptured), paternal grandmother, paternal cousin, paternal aunt.  PAST MEDICAL HISTORY: Past Medical History:  Diagnosis Date  . Anemia   . Menorrhagia 07/2017    MEDICATIONS: Current Outpatient Medications on File Prior to Visit  Medication Sig Dispense Refill  . acetaminophen (TYLENOL) 500 MG tablet Take 1,000 mg by mouth every 6 (six) hours as needed.    Marland Kitchen ketorolac (TORADOL) 10 MG tablet Take 1 tablet (10 mg total) by mouth every 6 (six) hours as needed. (Patient not taking: Reported on 08/06/2018) 20 tablet 0  . medroxyPROGESTERone (PROVERA) 5 MG tablet Take 1 tablet (5 mg total) by mouth daily for 14 days. 14 tablet 0  . sertraline (ZOLOFT) 50 MG tablet Take 1.5 tablets (75 mg total) by mouth daily. 45 tablet 3  . SUMAtriptan (IMITREX) 100 MG tablet Take 1 tablet earliest onset of migraine.  May repeat x1 in 2 hours if headache persists or recurs.  Do not exceed 2  tablets in 24h 10 tablet 3   No current facility-administered medications on file prior to visit.      ALLERGIES: Allergies  Allergen Reactions  . Latex Itching    FAMILY HISTORY: Family History  Problem Relation Age of Onset  . Cancer Mother   . Breast cancer Mother   . Diabetes Father   . Cancer Sister   . Breast cancer Sister   . Alzheimer's disease Paternal Grandmother   . Anuerysm Other        Paternal family Hx   SOCIAL HISTORY: Social History   Socioeconomic History  . Marital status: Significant Other    Spouse name: Not on file  . Number of children: 2  . Years of education: Not on file  . Highest education level: Associate degree: occupational, Hotel manager, or vocational program  Occupational History  . Occupation: Network engineer: [employer]  . Financial resource strain: Not on file  . Food insecurity:    Worry: Not on file    Inability: Not on file  . Transportation needs:    Medical: Not on file    Non-medical: Not on file  Tobacco Use  . Smoking status: Former Smoker    Packs/day: 0.00    Last attempt to quit: 11/25/2015    Years since quitting: 2.9  . Smokeless tobacco: Never Used  Substance and Sexual Activity  . Alcohol use: No    Alcohol/week: 0.0 standard drinks  . Drug use: No  . Sexual activity: Yes    Partners: Male    Birth control/protection: Surgical  Lifestyle  . Physical activity:    Days per week: Not on file    Minutes per session: Not on file  . Stress: Not on file  Relationships  . Social connections:    Talks on phone: Not on file    Gets together: Not on file    Attends religious service: Not on file    Active member of club or organization: Not on file    Attends meetings of clubs or organizations: Not on file    Relationship status: Not on file  . Intimate partner violence:    Fear of current or ex partner: Not on file    Emotionally abused: Not on file    Physically abused: Not on file    Forced sexual activity: Not on file  Other Topics Concern  . Not on file  Social History  Narrative   Patient is left-handed.she lives with her fiance, her 2 children and his 2 children in a 2 story house. She drinks 16 oz of coffee a day. An occasional glass of tea, and rarely soda. She does not exercise. She is currently taking classes in real estate school.    REVIEW OF SYSTEMS: Constitutional: No fevers, chills, or sweats, no generalized fatigue, change in appetite Eyes: No visual changes, double vision, eye pain Ear, nose and throat: No hearing loss, ear pain, nasal congestion, sore throat Cardiovascular: No chest pain, palpitations Respiratory:  No shortness of breath at rest or with exertion, wheezes GastrointestinaI: No nausea, vomiting, diarrhea, abdominal pain, fecal incontinence Genitourinary:  No dysuria, urinary retention or frequency Musculoskeletal:  No neck pain, back pain Integumentary: No rash, pruritus, skin lesions Neurological: as above Psychiatric: No depression, insomnia, anxiety Endocrine: No palpitations, fatigue, diaphoresis, mood swings, change in appetite, change in weight, increased thirst Hematologic/Lymphatic:  No purpura, petechiae. Allergic/Immunologic: no itchy/runny eyes, nasal congestion, recent allergic reactions, rashes  PHYSICAL EXAM: Blood pressure 98/68, pulse 71, height 5\' 4"  (1.626 m), weight 113 lb (51.3 kg), SpO2 99 %. General: No acute distress.  Patient appears well-groomed.  Head:  Normocephalic/atraumatic Eyes:  Fundi examined but not visualized Neck: supple, no paraspinal tenderness, full range of motion Heart:  Regular rate and rhythm Lungs:  Clear to auscultation bilaterally Back: No paraspinal tenderness Neurological Exam: alert and oriented to person, place, and time. Attention span and concentration intact, recent and remote memory intact, fund of knowledge intact.  Speech fluent and not dysarthric, language intact.  CN II-XII intact. Bulk and tone normal, muscle strength 5/5 throughout.  Sensation to light touch intact.   Deep tendon reflexes 2+ throughout, toes downgoing.  Finger to nose testing intact.  Gait normal, Romberg negative.  IMPRESSION: Chronic migraine without aura, without status migrainosus, intractable Questionable cerebral aneurysm.  PLAN: 1.  For preventative management, she will stop sertraline and instead start topiramate 50mg  at bedtime.  We can increase dose to 100mg  at bedtime in 4 weeks if needed.  She is advised to take birth control precautions. 2.  Stop sumatriptan.  For abortive therapy, will try flurbiprofen 100mg  every 8 hours PRN, max 300mg /24h.  Avoid triptans until further evaluation of possible aneurysm completed. 3.  Limit use of pain relievers to no more than 2 days out of week to prevent risk of rebound or medication-overuse headache. 4.  Keep headache diary 5.  Exercise, hydration, caffeine cessation, sleep hygiene, monitor for and avoid triggers 6.  Consider:  magnesium citrate 400mg  daily, riboflavin 400mg  daily, and coenzyme Q10 100mg  three times daily 7. Will check CTA of head to better evaluate 8. Follow up in 3 to 4 months.    Metta Clines, DO

## 2018-10-29 NOTE — Patient Instructions (Addendum)
Migraine Recommendations: 1.  STOP SERTRALINE.  Start topiramate 50mg  at bedtime.  Contact us in 4 weeks with update and we can adjust dose if needed. 2.  STOP SUMATRIPTAN.  Take flurbiprofen 100mg  at earliest onset of headache.  May repeat dose every 8 hours, maximum of 3 tablets in 24 hours. 3.  Limit use of pain relievers to no more than 2 days out of the week.  These medications include acetaminophen, ibuprofen, triptans and narcotics.  This will help reduce risk of rebound headaches. 4.  Be aware of common food triggers such as processed sweets, processed foods with nitrites (such as deli meat, hot dogs, sausages), foods with MSG, alcohol (such as wine), chocolate, certain cheeses, certain fruits (dried fruits, bananas, pineapple), vinegar, diet soda. 4.  Avoid caffeine 5.  Routine exercise 6.  Proper sleep hygiene 7.  Stay adequately hydrated with water 8.  Keep a headache diary. 9.  Maintain proper stress management. 10.  Do not skip meals. 11.  Consider supplements:  Magnesium citrate 400mg  to 600mg  daily, riboflavin 400mg , Coenzyme Q 10 100mg  three times daily 12. Will check CTA of head to evaluate for aneurysm. 13.  Follow up in 3 to 4 months.  Your provider has requested that you have labwork completed today. Please go to [facility] ([id number]) on the second floor of this building before leaving the office today. You do not need to check in. If you are not called within 15 minutes please check with the front desk.   We have sent a referral to [facility] for your [facility] and they will call you directly to schedule your appt. They are located at [place]. If you need to contact them directly please call [phone].    Migraine Headache A migraine headache is an intense, throbbing pain on one side or both sides of the head. Migraines may also cause other symptoms, such as nausea, vomiting, and sensitivity to light and noise. What are the causes? Doing or  taking certain things may also trigger migraines, such as:  Alcohol.  Smoking.  Medicines, such as: ? Medicine used to treat chest pain (nitroglycerine). ? Birth control pills. ? Estrogen pills. ? Certain blood pressure medicines.  Aged cheeses, chocolate, or caffeine.  Foods or drinks that contain nitrates, glutamate, aspartame, or tyramine.  Physical activity.  Other things that may trigger a migraine include:  Menstruation.  Pregnancy.  Hunger.  Stress, lack of sleep, too much sleep, or fatigue.  Weather changes.  What increases the risk? The following factors may make you more likely to experience migraine headaches:  Age. Risk increases with age.  Family history of migraine headaches.  Being Caucasian.  Depression and anxiety.  Obesity.  Being a woman.  Having a hole in the heart (patent foramen ovale) or other heart problems.  What are the signs or symptoms? The main symptom of this condition is pulsating or throbbing pain. Pain may:  Happen in any area of the head, such as on one side or both sides.  Interfere with daily activities.  Get worse with physical activity.  Get worse with exposure to bright lights or loud noises.  Other symptoms may include:  Nausea.  Vomiting.  Dizziness.  General sensitivity to bright lights, loud noises, or smells.  Before you get a migraine, you may get warning signs that a migraine is developing (aura). An aura may include:  Seeing flashing lights or having blind spots.  Seeing bright spots, halos, or  zigzag lines.  Having tunnel vision or blurred vision.  Having numbness or a tingling feeling.  Having trouble talking.  Having muscle weakness.  How is this diagnosed? A migraine headache can be diagnosed based on:  Your symptoms.  A physical exam.  Tests, such as CT scan or MRI of the head. These imaging tests can help rule out other causes of headaches.  Taking fluid from the spine  (lumbar puncture) and analyzing it (cerebrospinal fluid analysis, or CSF analysis).  How is this treated? A migraine headache is usually treated with medicines that:  Relieve pain.  Relieve nausea.  Prevent migraines from coming back.  Treatment may also include:  Acupuncture.  Lifestyle changes like avoiding foods that trigger migraines.  Follow these instructions at home: Medicines  Take over-the-counter and prescription medicines only as told by your health care provider.  Do not drive or use heavy machinery while taking prescription pain medicine.  To prevent or treat constipation while you are taking prescription pain medicine, your health care provider may recommend that you: ? Drink enough fluid to keep your urine clear or pale yellow. ? Take over-the-counter or prescription medicines. ? Eat foods that are high in fiber, such as fresh fruits and vegetables, whole grains, and beans. ? Limit foods that are high in fat and processed sugars, such as fried and sweet foods. Lifestyle  Avoid alcohol use.  Do not use any products that contain nicotine or tobacco, such as cigarettes and e-cigarettes. If you need help quitting, ask your health care provider.  Get at least 8 hours of sleep every night.  Limit your stress. General instructions   Keep a journal to find out what may trigger your migraine headaches. For example, write down: ? What you eat and drink. ? How much sleep you get. ? Any change to your diet or medicines.  If you have a migraine: ? Avoid things that make your symptoms worse, such as bright lights. ? It may help to lie down in a dark, quiet room. ? Do not drive or use heavy machinery. ? Ask your health care provider what activities are safe for you while you are experiencing symptoms.  Keep all follow-up visits as told by your health care provider. This is important. Contact a health care provider if:  You develop symptoms that are different or  more severe than your usual migraine symptoms. Get help right away if:  Your migraine becomes severe.  You have a fever.  You have a stiff neck.  You have vision loss.  Your muscles feel weak or like you cannot control them.  You start to lose your balance often.  You develop trouble walking.  You faint. This information is not intended to replace advice given to you by your health care provider. Make sure you discuss any questions you have with your health care provider. Document Released: 11/11/2005 Document Revised: 05/31/2016 Document Reviewed: 04/29/2016 Elsevier Interactive Patient Education  2017 [employer].

## 2018-10-29 NOTE — Telephone Encounter (Signed)
Pt was scheduled and seen today

## 2018-11-11 ENCOUNTER — Ambulatory Visit
Admission: RE | Admit: 2018-11-11 | Discharge: 2018-11-11 | Disposition: A | Discharge status: Home / Self Care | Payer: 59 | Source: Ambulatory Visit | Attending: Neurology | Admitting: Neurology

## 2018-11-11 DIAGNOSIS — I671 Cerebral aneurysm, nonruptured: Secondary | ICD-10-CM

## 2018-11-11 MED ORDER — IOPAMIDOL (ISOVUE-370) INJECTION 76%
75.0000 mL | Freq: Once | INTRAVENOUS | Status: AC | PRN
  Administered 2018-11-11: 75 mL via INTRAVENOUS

## 2018-11-12 ENCOUNTER — Telehealth: Payer: Self-pay

## 2018-11-12 NOTE — Telephone Encounter (Signed)
Called and advised Pt of results. 

## 2018-11-12 NOTE — Telephone Encounter (Signed)
-----   Message from Pieter Partridge, DO sent at 11/12/2018  7:20 AM EST ----- CTA, which is a more sensitive test to look at the blood vessels in the head, is normal and confirming no evidence of aneurysm.

## 2018-12-08 ENCOUNTER — Ambulatory Visit: Payer: Self-pay | Admitting: Neurology

## 2018-12-24 ENCOUNTER — Telehealth: Payer: Self-pay

## 2018-12-24 NOTE — Telephone Encounter (Signed)
Returned call, no answer, left vm 

## 2018-12-25 ENCOUNTER — Telehealth: Payer: Self-pay

## 2018-12-25 NOTE — Telephone Encounter (Signed)
Returned call, pt reports vaginal odor after intercourse and pelvic cramping. Pt would like appt.  Advised scheduler will call with appt.

## 2018-12-29 ENCOUNTER — Ambulatory Visit (INDEPENDENT_AMBULATORY_CARE_PROVIDER_SITE_OTHER): Payer: 59 | Admitting: Obstetrics

## 2018-12-29 ENCOUNTER — Encounter: Payer: Self-pay | Admitting: Obstetrics

## 2018-12-29 ENCOUNTER — Other Ambulatory Visit: Payer: Self-pay

## 2018-12-29 VITALS — BP 116/78 | HR 90 | Ht 64.0 in | Wt 110.8 lb

## 2018-12-29 DIAGNOSIS — B3731 Acute candidiasis of vulva and vagina: Secondary | ICD-10-CM

## 2018-12-29 DIAGNOSIS — N760 Acute vaginitis: Secondary | ICD-10-CM | POA: Diagnosis not present

## 2018-12-29 DIAGNOSIS — B373 Candidiasis of vulva and vagina: Secondary | ICD-10-CM

## 2018-12-29 DIAGNOSIS — Z113 Encounter for screening for infections with a predominantly sexual mode of transmission: Secondary | ICD-10-CM

## 2018-12-29 DIAGNOSIS — N9411 Superficial (introital) dyspareunia: Secondary | ICD-10-CM | POA: Diagnosis not present

## 2018-12-29 DIAGNOSIS — B9689 Other specified bacterial agents as the cause of diseases classified elsewhere: Secondary | ICD-10-CM | POA: Diagnosis not present

## 2018-12-29 DIAGNOSIS — N898 Other specified noninflammatory disorders of vagina: Secondary | ICD-10-CM | POA: Diagnosis not present

## 2018-12-29 DIAGNOSIS — N946 Dysmenorrhea, unspecified: Secondary | ICD-10-CM

## 2018-12-29 MED ORDER — FLUCONAZOLE 150 MG PO TABS: 150.0000 mg | ORAL_TABLET | Freq: Once | ORAL | 2 refills | Status: AC

## 2018-12-29 MED ORDER — TINIDAZOLE 500 MG PO TABS: 1000.0000 mg | ORAL_TABLET | Freq: Every day | ORAL | 2 refills | Status: DC

## 2018-12-29 MED ORDER — KETOROLAC TROMETHAMINE 10 MG PO TABS: 10.0000 mg | ORAL_TABLET | Freq: Four times a day (QID) | ORAL | 2 refills | Status: DC | PRN

## 2018-12-29 NOTE — Progress Notes (Signed)
Patient ID: Erika [name], female   DOB: 02-26-1981, 38 y.o.   MRN: [id number]  Chief Complaint  Patient presents with  . Vaginal Discharge  . Vaginal Pain    HPI Erika [name] is a 38 y.o. female.  Painful intercourse.  Vaginal irritation.  Malodorous vaginal discharge. HPI  Past Medical History:  Diagnosis Date  . Anemia   . Menorrhagia 07/2017    Past Surgical History:  Procedure Laterality Date  . ENDOMETRIAL ABLATION N/A 08/27/2017   Procedure: ENDOMETRIAL ABLATION WITH NOVASURE;  Surgeon: Woodroe Mode, MD;  Location: [place];  Service: Gynecology;  Laterality: N/A;  . IUD REMOVAL  09/07/2007  . LEEP  04/22/2008  . TUBAL LIGATION  09/07/2007    Family History  Problem Relation Age of Onset  . Cancer Mother   . Breast cancer Mother   . Diabetes Father   . Cancer Sister   . Breast cancer Sister   . Alzheimer's disease Paternal Grandmother   . Anuerysm Other        Paternal family Hx    Social History Social History   Tobacco Use  . Smoking status: Former Smoker    Packs/day: 0.00    Last attempt to quit: 11/25/2015    Years since quitting: 3.0  . Smokeless tobacco: Never Used  Substance Use Topics  . Alcohol use: No    Alcohol/week: 0.0 standard drinks  . Drug use: No    Allergies  Allergen Reactions  . Latex Itching    Current Outpatient Medications  Medication Sig Dispense Refill  . fluconazole (DIFLUCAN) 150 MG tablet Take 1 tablet (150 mg total) by mouth once for 1 dose. 1 tablet 2  . flurbiprofen (ANSAID) 100 MG tablet Take 1 tablet every 8 hours as needed, maximum 3 tablets in 24 hours 30 tablet 3  . ketorolac (TORADOL) 10 MG tablet Take 1 tablet (10 mg total) by mouth every 6 (six) hours as needed. 20 tablet 2  . tinidazole (TINDAMAX) 500 MG tablet Take 2 tablets (1,000 mg total) by mouth daily with breakfast. 10 tablet 2  . topiramate (TOPAMAX) 50 MG tablet Take 1 tablet (50 mg total) by mouth at bedtime. 30 tablet 3    No current facility-administered medications for this visit.     Review of Systems Review of Systems Constitutional: negative for fatigue and weight loss Respiratory: negative for cough and wheezing Cardiovascular: negative for chest pain, fatigue and palpitations Gastrointestinal: negative for abdominal pain and change in bowel habits Genitourinary:POSITIVE for pain with intercourse and vaginal irritation.  Malodorous vaginal discharge Integument/breast: negative for nipple discharge Musculoskeletal:negative for myalgias Neurological: negative for gait problems and tremors Behavioral/Psych: negative for abusive relationship, depression Endocrine: negative for temperature intolerance      Blood pressure 116/78, pulse 90, height 5\' 4"  (1.626 m), weight 110 lb 12.8 oz (50.3 kg).  Physical Exam Physical Exam           General:  Alert and no distress Abdomen:  normal findings: no organomegaly, soft, non-tender and no hernia  Pelvis:  External genitalia: normal general appearance Urinary system: urethral meatus normal and bladder without fullness, nontender Vaginal: normal without tenderness, induration or masses Cervix: normal appearance Adnexa: normal bimanual exam Uterus: anteverted and non-tender, normal size    50% of 15 min visit spent on counseling and coordination of care.   Data Reviewed Wet Prep Cultures  Assessment     1. Introital dyspareunia  2. Vaginal discharge Rx: -  Cervicovaginal ancillary only( [place])  3. BV (bacterial vaginosis) Rx: - tinidazole (TINDAMAX) 500 MG tablet; Take 2 tablets (1,000 mg total) by mouth daily with breakfast.  Dispense: 10 tablet; Refill: 2  4. Candida vaginitis Rx: - fluconazole (DIFLUCAN) 150 MG tablet; Take 1 tablet (150 mg total) by mouth once for 1 dose.  Dispense: 1 tablet; Refill: 2  5. Dysmenorrhea Rx: - ketorolac (TORADOL) 10 MG tablet; Take 1 tablet (10 mg total) by mouth every 6 (six) hours as needed.   Dispense: 20 tablet; Refill: 2      Plan    Follow up in 3 months  No orders of the defined types were placed in this encounter.  Meds ordered this encounter  Medications  . ketorolac (TORADOL) 10 MG tablet    Sig: Take 1 tablet (10 mg total) by mouth every 6 (six) hours as needed.    Dispense:  20 tablet    Refill:  2  . tinidazole (TINDAMAX) 500 MG tablet    Sig: Take 2 tablets (1,000 mg total) by mouth daily with breakfast.    Dispense:  10 tablet    Refill:  2  . fluconazole (DIFLUCAN) 150 MG tablet    Sig: Take 1 tablet (150 mg total) by mouth once for 1 dose.    Dispense:  1 tablet    Refill:  2    Shelly Bombard MD 12-29-2018

## 2018-12-29 NOTE — Progress Notes (Signed)
Presents for odor after sex, cramping 7/10, discharge, itching and burning in vagina only when she showers x 3 weeks.  Last PAP was 03/04/18

## 2018-12-29 NOTE — Patient Instructions (Signed)
Bacterial Vaginosis    Bacterial vaginosis is a vaginal infection that occurs when the normal balance of bacteria in the vagina is disrupted. It results from an overgrowth of certain bacteria. This is the most common vaginal infection among women ages 15-44.  Because bacterial vaginosis increases your risk for STIs (sexually transmitted infections), getting treated can help reduce your risk for chlamydia, gonorrhea, herpes, and HIV (human immunodeficiency virus). Treatment is also important for preventing complications in pregnant women, because this condition can cause an early (premature) delivery.  What are the causes?  This condition is caused by an increase in harmful bacteria that are normally present in small amounts in the vagina. However, the reason that the condition develops is not fully understood.  What increases the risk?  The following factors may make you more likely to develop this condition:  · Having a new sexual partner or multiple sexual partners.  · Having unprotected sex.  · Douching.  · Having an intrauterine device (IUD).  · Smoking.  · Drug and alcohol abuse.  · Taking certain antibiotic medicines.  · Being pregnant.  You cannot get bacterial vaginosis from toilet seats, bedding, swimming pools, or contact with objects around you.  What are the signs or symptoms?  Symptoms of this condition include:  · Grey or white vaginal discharge. The discharge can also be watery or foamy.  · A fish-like odor with discharge, especially after sexual intercourse or during menstruation.  · Itching in and around the vagina.  · Burning or pain with urination.  Some women with bacterial vaginosis have no signs or symptoms.  How is this diagnosed?  This condition is diagnosed based on:  · Your medical history.  · A physical exam of the vagina.  · Testing a sample of vaginal fluid under a microscope to look for a large amount of bad bacteria or abnormal cells. Your health care provider may use a cotton swab or  a small wooden spatula to collect the sample.  How is this treated?  This condition is treated with antibiotics. These may be given as a pill, a vaginal cream, or a medicine that is put into the vagina (suppository). If the condition comes back after treatment, a second round of antibiotics may be needed.  Follow these instructions at home:  Medicines  · Take over-the-counter and prescription medicines only as told by your health care provider.  · Take or use your antibiotic as told by your health care provider. Do not stop taking or using the antibiotic even if you start to feel better.  General instructions  · If you have a female sexual partner, tell her that you have a vaginal infection. She should see her health care provider and be treated if she has symptoms. If you have a female sexual partner, he does not need treatment.  · During treatment:  ? Avoid sexual activity until you finish treatment.  ? Do not douche.  ? Avoid alcohol as directed by your health care provider.  ? Avoid breastfeeding as directed by your health care provider.  · Drink enough water and fluids to keep your urine clear or pale yellow.  · Keep the area around your vagina and rectum clean.  ? Wash the area daily with warm water.  ? Wipe yourself from front to back after using the toilet.  · Keep all follow-up visits as told by your health care provider. This is important.  How is this prevented?  · Do not   douche.  · Wash the outside of your vagina with warm water only.  · Use protection when having sex. This includes latex condoms and dental dams.  · Limit how many sexual partners you have. To help prevent bacterial vaginosis, it is best to have sex with just one partner (monogamous).  · Make sure you and your sexual partner are tested for STIs.  · Wear cotton or cotton-lined underwear.  · Avoid wearing tight pants and pantyhose, especially during summer.  · Limit the amount of alcohol that you drink.  · Do not use any products that contain  nicotine or tobacco, such as cigarettes and e-cigarettes. If you need help quitting, ask your health care provider.  · Do not use illegal drugs.  Where to find more information  · Centers for Disease Control and Prevention: www.cdc.gov/std  · American Sexual Health Association (ASHA): www.ashastd.org  · U.S. Department of Health and Human Services, Office on Women's Health: www.womenshealth.gov/ or https://www.womenshealth.gov/a-z-topics/bacterial-vaginosis  Contact a health care provider if:  · Your symptoms do not improve, even after treatment.  · You have more discharge or pain when urinating.  · You have a fever.  · You have pain in your abdomen.  · You have pain during sex.  · You have vaginal bleeding between periods.  Summary  · Bacterial vaginosis is a vaginal infection that occurs when the normal balance of bacteria in the vagina is disrupted.  · Because bacterial vaginosis increases your risk for STIs (sexually transmitted infections), getting treated can help reduce your risk for chlamydia, gonorrhea, herpes, and HIV (human immunodeficiency virus). Treatment is also important for preventing complications in pregnant women, because the condition can cause an early (premature) delivery.  · This condition is treated with antibiotic medicines. These may be given as a pill, a vaginal cream, or a medicine that is put into the vagina (suppository).  This information is not intended to replace advice given to you by your health care provider. Make sure you discuss any questions you have with your health care provider.  Document Released: 11/11/2005 Document Revised: 03/17/2017 Document Reviewed: 07/27/2016  Elsevier Interactive Patient Education © 2019 Elsevier Inc.

## 2018-12-31 ENCOUNTER — Other Ambulatory Visit: Payer: Self-pay | Admitting: Obstetrics

## 2018-12-31 LAB — CERVICOVAGINAL ANCILLARY ONLY
Bacterial vaginitis: POSITIVE — AB
Candida vaginitis: NEGATIVE
Chlamydia: NEGATIVE
Neisseria Gonorrhea: NEGATIVE
Trichomonas: NEGATIVE

## 2019-01-13 NOTE — Telephone Encounter (Signed)
Error

## 2019-01-15 ENCOUNTER — Telehealth: Payer: Self-pay | Admitting: Neurology

## 2019-01-15 MED ORDER — TOPIRAMATE 100 MG PO TABS: 100.0000 mg | ORAL_TABLET | Freq: Every day | ORAL | 3 refills | Status: DC

## 2019-01-15 NOTE — Telephone Encounter (Signed)
Patient called regarding her Ongoing Headaches. She would like you to please call her. Thanks

## 2019-01-15 NOTE — Telephone Encounter (Signed)
Called and spoke with Pt, advised her to increase the topiramate to 100 mg QHS and call in 4 wks if headaches are still not controlled.

## 2019-01-26 NOTE — Progress Notes (Deleted)
NEUROLOGY FOLLOW UP OFFICE NOTE  Erika [name] [id number]  HISTORY OF PRESENT ILLNESS: Erika [name] is a 38 year old woman who follows up for migraines.  UPDATE: Due to possible cerebral aneurysm on MRA, she had a CTA on 11/11/2018 which was personally reviewed and demonstrated no aneurysm.  Intensity:  Severe Duration:  Uncertain because would go to sleep.  But likely 2 hours. Frequency:  14 days in November Frequency of abortive medication: 2 days out of the week. Current NSAIDS: Ketorolac 10 mg as needed (not for headache) Current analgesics: None Current triptans:  None Current ergotamine: None Current anti-emetic: None Current muscle relaxants: None Current anti-anxiolytic: None Current sleep aide: None Current Antihypertensive medications: None Current Antidepressant medications:  None Current Anticonvulsant medications:  Topiramate 100 mg at bedtime Current anti-CGRP: None Current Vitamins/Herbal/Supplements: None Current Antihistamines/Decongestants: None Other therapy: None Hormone/birth control: Provera  Caffeine: 1 16 ounce cup of decaf coffee daily Alcohol: None Smoker: No Diet: Increased water intake.   Exercise: Not routine Depression: Yes; Anxiety: Yes.  Sees a therapist. Other pain: Cramps Sleep hygiene: Usually good  HISTORY:  Onset: In her late 47s, worse since 2019. Location:Bifrontal, sometimes suboccipital region Quality:stabbing Initial intensity:Severe. Shedenies new headache, thunderclap headache or severe headache that wakes herfrom sleep. Aura:no Prodrome:no Postdrome:no Associated symptoms: Nausea, vomiting, photophobia, phonophobia, osmophobia.Shedenies associated visual disturbance orunilateral numbness or weakness. Initial duration:All day Initial Frequency:Inconsistent. It occurs 3 to 4 days to a week with 2 day break in between Initial Frequency of abortive medication:no Triggers: Strong  perfumes Exacerbating factors:Lights, strong perfume, computer screen Relieving factors: Sleep and dark quiet room Activity:Aggravates. 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 Family history of headache:Dad Family history of cerebral aneurysm: Dad (ruptured), paternal grandmother, paternal cousin, paternal aunt.  MRI of the brain without contrast from 08/20/2018 was personally reviewed and was normal.  MRA of the head showed questionable 2 mm focal outpouching at the left MCA bifurcation was seen, uncertain if motion artifact or tiny aneurysm.  PAST MEDICAL HISTORY: Past Medical History:  Diagnosis Date  . Anemia   . Menorrhagia 07/2017    MEDICATIONS: Current Outpatient Medications on File Prior to Visit  Medication Sig Dispense Refill  . flurbiprofen (ANSAID) 100 MG tablet Take 1 tablet every 8 hours as needed, maximum 3 tablets in 24 hours 30 tablet 3  . ketorolac (TORADOL) 10 MG tablet Take 1 tablet (10 mg total) by mouth every 6 (six) hours as needed. 20 tablet 2  . tinidazole (TINDAMAX) 500 MG tablet Take 2 tablets (1,000 mg total) by mouth daily with breakfast. 10 tablet 2  . topiramate (TOPAMAX) 100 MG tablet Take 1 tablet (100 mg total) by mouth at bedtime. 30 tablet 3  . topiramate (TOPAMAX) 50 MG tablet Take 1 tablet (50 mg total) by mouth at bedtime. 30 tablet 3   No current facility-administered  medications on file prior to visit.     ALLERGIES: Allergies  Allergen Reactions  . Latex Itching    FAMILY HISTORY: Family History  Problem Relation Age of Onset  . Cancer Mother   . Breast cancer Mother   .  Diabetes Father   . Cancer Sister   . Breast cancer Sister   . Alzheimer's disease Paternal Grandmother   . Anuerysm Other        Paternal family Hx   ***.  SOCIAL HISTORY: Social History   Socioeconomic History  . Marital status: Significant Other    Spouse name: Not on file  . Number of children: 2  . Years of education: Not on file  . Highest education level: Associate degree: occupational, Hotel manager, or vocational program  Occupational History  . Occupation: Network engineer: [employer]  . Financial resource strain: Not on file  . Food insecurity:    Worry: Not on file    Inability: Not on file  . Transportation needs:    Medical: Not on file    Non-medical: Not on file  Tobacco Use  . Smoking status: Former Smoker    Packs/day: 0.00    Last attempt to quit: 11/25/2015    Years since quitting: 3.1  . Smokeless tobacco: Never Used  Substance and Sexual Activity  . Alcohol use: No    Alcohol/week: 0.0 standard drinks  . Drug use: No  . Sexual activity: Yes    Partners: Male    Birth control/protection: Surgical  Lifestyle  . Physical activity:    Days per week: Not on file    Minutes per session: Not on file  . Stress: Not on file  Relationships  . Social connections:    Talks on phone: Not on file    Gets together: Not on file    Attends religious service: Not on file    Active member of club or organization: Not on file    Attends meetings of clubs or organizations: Not on file    Relationship status: Not on file  . Intimate partner violence:    Fear of current or ex partner: Not on file    Emotionally abused: Not on file    Physically abused: Not on file    Forced sexual activity: Not on file  Other  Topics Concern  . Not on file  Social History Narrative   Patient is left-handed.she lives with her fiance, her 2 children and his 2 children in a 2 story house. She drinks 16 oz of coffee a day. An occasional glass of tea, and rarely soda. She does not exercise. She is currently taking classes in real estate school.    REVIEW OF SYSTEMS: Constitutional: No fevers, chills, or sweats, no generalized fatigue, change in appetite Eyes: No visual changes, double vision, eye pain Ear, nose and throat: No hearing loss, ear pain, nasal congestion, sore throat Cardiovascular: No chest pain, palpitations Respiratory:  No shortness of breath at rest or with exertion, wheezes GastrointestinaI: No nausea, vomiting, diarrhea, abdominal pain, fecal incontinence Genitourinary:  No dysuria, urinary retention or frequency Musculoskeletal:  No neck pain, back pain Integumentary: No rash, pruritus, skin lesions Neurological: as above Psychiatric: No depression, insomnia, anxiety Endocrine: No palpitations, fatigue, diaphoresis, mood swings, change in appetite, change in weight, increased thirst Hematologic/Lymphatic:  No purpura, petechiae. Allergic/Immunologic: no itchy/runny eyes, nasal congestion, recent allergic reactions, rashes  PHYSICAL EXAM: *** General: No acute distress.  Patient appears ***-groomed.  *** body habitus. Head:  Normocephalic/atraumatic Eyes:  Fundi examined but not visualized Neck: supple, no paraspinal tenderness, full range of motion Heart:  Regular rate and rhythm Lungs:  Clear to auscultation bilaterally Back: No paraspinal tenderness Neurological Exam: alert and  oriented to person, place, and time. Attention span and concentration intact, recent and remote memory intact, fund of knowledge intact.  Speech fluent and not dysarthric, language intact.  CN II-XII intact. Bulk and tone normal, muscle strength 5/5 throughout.  Sensation to light touch, temperature and vibration  intact.  Deep tendon reflexes 2+ throughout, toes downgoing.  Finger to nose and heel to shin testing intact.  Gait normal, Romberg negative.  IMPRESSION: ***  PLAN: ***  Metta Clines, DO

## 2019-01-28 ENCOUNTER — Ambulatory Visit: Payer: Self-pay | Admitting: Neurology

## 2019-02-11 ENCOUNTER — Ambulatory Visit: Payer: Self-pay | Admitting: Neurology

## 2019-05-13 ENCOUNTER — Other Ambulatory Visit: Payer: Self-pay | Admitting: Neurology

## 2019-11-23 ENCOUNTER — Other Ambulatory Visit: Payer: Self-pay

## 2019-11-23 ENCOUNTER — Ambulatory Visit (INDEPENDENT_AMBULATORY_CARE_PROVIDER_SITE_OTHER): Payer: PRIVATE HEALTH INSURANCE

## 2019-11-23 VITALS — BP 119/79 | HR 74 | Ht 63.0 in | Wt 120.0 lb

## 2019-11-23 DIAGNOSIS — N898 Other specified noninflammatory disorders of vagina: Secondary | ICD-10-CM | POA: Diagnosis not present

## 2019-11-23 DIAGNOSIS — R1032 Left lower quadrant pain: Secondary | ICD-10-CM | POA: Diagnosis not present

## 2019-11-23 DIAGNOSIS — Z3202 Encounter for pregnancy test, result negative: Secondary | ICD-10-CM

## 2019-11-23 LAB — POCT URINALYSIS DIPSTICK
Glucose, UA: NEGATIVE
Leukocytes, UA: NEGATIVE
Nitrite, UA: NEGATIVE
Protein, UA: POSITIVE — AB
Spec Grav, UA: 1.025 (ref 1.010–1.025)
Urobilinogen, UA: 0.2 E.U./dL
pH, UA: 5 (ref 5.0–8.0)

## 2019-11-23 LAB — POCT URINE PREGNANCY: Preg Test, Ur: NEGATIVE

## 2019-11-23 MED ORDER — IBUPROFEN 600 MG PO TABS: 600.0000 mg | ORAL_TABLET | Freq: Four times a day (QID) | ORAL | 1 refills | Status: DC | PRN

## 2019-11-23 NOTE — Progress Notes (Signed)
SUBJECTIVE:  38 y.o. female complains of dark vaginal discharge for 2 month(s). Denies abnormal vaginal bleeding or significant pelvic pain or fever. No UTI symptoms. Denies history of known exposure to STD.  No LMP recorded. Patient has had an ablation.  OBJECTIVE:  She appears well, afebrile. Urine dipstick: positive for protein, ketones. UPT is Negative  ASSESSMENT:  Vaginal Discharge  Lower abdominal pain/cramping   PLAN:  GC, chlamydia, trichomonas, BVAG, CVAG probe, Urine culture sent to lab. Treatment: To be determined once lab results are received. Ibuprofen 600 mg  ROV prn if symptoms persist or worsen.

## 2019-11-23 NOTE — Progress Notes (Signed)
Chart reviewed - agree with RN documentation.   

## 2019-11-24 LAB — CERVICOVAGINAL ANCILLARY ONLY
Bacterial Vaginitis (gardnerella): NEGATIVE
Candida Glabrata: NEGATIVE
Candida Vaginitis: NEGATIVE
Chlamydia: NEGATIVE
Comment: NEGATIVE
Comment: NEGATIVE
Comment: NEGATIVE
Comment: NEGATIVE
Comment: NORMAL
Neisseria Gonorrhea: NEGATIVE

## 2019-11-25 LAB — URINE CULTURE: Organism ID, Bacteria: NO GROWTH

## 2019-11-30 ENCOUNTER — Encounter: Payer: Self-pay | Admitting: Obstetrics

## 2019-11-30 ENCOUNTER — Ambulatory Visit (INDEPENDENT_AMBULATORY_CARE_PROVIDER_SITE_OTHER): Payer: PRIVATE HEALTH INSURANCE | Admitting: Obstetrics

## 2019-11-30 ENCOUNTER — Other Ambulatory Visit: Payer: Self-pay

## 2019-11-30 VITALS — BP 122/84 | HR 80 | Wt 119.0 lb

## 2019-11-30 DIAGNOSIS — Z9889 Other specified postprocedural states: Secondary | ICD-10-CM

## 2019-11-30 DIAGNOSIS — R102 Pelvic and perineal pain: Secondary | ICD-10-CM

## 2019-11-30 DIAGNOSIS — Z1151 Encounter for screening for human papillomavirus (HPV): Secondary | ICD-10-CM | POA: Diagnosis not present

## 2019-11-30 DIAGNOSIS — Z113 Encounter for screening for infections with a predominantly sexual mode of transmission: Secondary | ICD-10-CM

## 2019-11-30 DIAGNOSIS — N898 Other specified noninflammatory disorders of vagina: Secondary | ICD-10-CM | POA: Diagnosis not present

## 2019-11-30 DIAGNOSIS — Z124 Encounter for screening for malignant neoplasm of cervix: Secondary | ICD-10-CM | POA: Diagnosis not present

## 2019-11-30 DIAGNOSIS — Z01419 Encounter for gynecological examination (general) (routine) without abnormal findings: Secondary | ICD-10-CM

## 2019-11-30 MED ORDER — OXYCODONE-ACETAMINOPHEN 5-325 MG PO TABS: 1.0000 | ORAL_TABLET | ORAL | 0 refills | Status: DC | PRN

## 2019-11-30 NOTE — Progress Notes (Signed)
Pt states cramping x couple months, has recently gotten worse. Pt states pain is more on left side. Pt had ablation 3-4 years ago.

## 2019-11-30 NOTE — Progress Notes (Signed)
Subjective:        Erika [name] is a 39 y.o. female here for a routine exam.  Current complaints: Increasing pelvic pain.  She had endometrial ablation done by Dr. Roselie Awkward 3 years ago, and has been amenorrheic since then.  Noticed the onset of pelvic pain ( LLQ ) 2 months ago, and it has gotten progressively worse.    Personal health questionnaire:  Is patient [name], have a family history of breast and/or ovarian cancer: yes Is there a family history of uterine cancer diagnosed at age < 62, gastrointestinal cancer, urinary tract cancer, family member who is a Field seismologist syndrome-associated carrier: no Is the patient overweight and hypertensive, family history of diabetes, personal history of gestational diabetes, preeclampsia or PCOS: yes Is patient over 25, have PCOS,  family history of premature CHD under age 34, diabetes, smoke, have hypertension or peripheral artery disease:  no At any time, has a partner hit, kicked or otherwise hurt or frightened you?: no Over the past 2 weeks, have you felt down, depressed or hopeless?: no Over the past 2 weeks, have you felt little interest or pleasure in doing things?:no   Gynecologic History No LMP recorded. Patient has had an ablation. Contraception: tubal ligation Last Pap: 03-04-2018. Results were: normal Last mammogram: 07-25-2017. Results were: normal  Obstetric History OB History  Gravida Para Term Preterm AB Living  3 2 2   1 2   SAB TAB Ectopic Multiple Live Births  1       2    # Outcome Date GA Lbr Len/2nd Weight Sex Delivery Anes PTL Lv  3 Term 07/06/03 [redacted]w[redacted]d  6 lb 8 oz (2.948 kg) M Vag-Spont None  LIV  2 Term 09/26/98 [redacted]w[redacted]d  6 lb 4 oz (2.835 kg) F Vag-Spont None  LIV  1 SAB             Past Medical History:  Diagnosis Date  . Anemia   . Menorrhagia 07/2017    Past Surgical History:  Procedure Laterality Date  . ENDOMETRIAL ABLATION N/A 08/27/2017   Procedure: ENDOMETRIAL ABLATION WITH NOVASURE;  Surgeon:  Woodroe Mode, MD;  Location: [facility];  Service: Gynecology;  Laterality: N/A;  . IUD REMOVAL  09/07/2007  . LEEP  04/22/2008  . TUBAL LIGATION  09/07/2007     Current Outpatient Medications:  .  ibuprofen (ADVIL) 600 MG tablet, Take 1 tablet (600 mg total) by mouth every 6 (six) hours as needed for moderate pain or cramping., Disp: 30 tablet, Rfl: 1 .  flurbiprofen (ANSAID) 100 MG tablet, Take 1 tablet every 8 hours as needed, maximum 3 tablets in 24 hours, Disp: 30 tablet, Rfl: 3 .  ketorolac (TORADOL) 10 MG tablet, Take 1 tablet (10 mg total) by mouth every 6 (six) hours as needed., Disp: 20 tablet, Rfl: 2 .  oxyCODONE-acetaminophen (PERCOCET/ROXICET) 5-325 MG tablet, Take 1-2 tablets by mouth every 4 (four) hours as needed for severe pain., Disp: 30 tablet, Rfl: 0 .  tinidazole (TINDAMAX) 500 MG tablet, Take 2 tablets (1,000 mg total) by mouth daily with breakfast., Disp: 10 tablet, Rfl: 2 .  topiramate (TOPAMAX) 100 MG tablet, TAKE 1 TABLET BY MOUTH EVERYDAY AT BEDTIME, Disp: 90 tablet, Rfl: 1 .  topiramate (TOPAMAX) 50 MG tablet, Take 1 tablet (50 mg total) by mouth at bedtime., Disp: 30 tablet, Rfl: 3 Allergies  Allergen Reactions  . Latex Itching    Social History   Tobacco Use  .  Smoking status: Former Smoker    Packs/day: 0.00    Quit date: 11/25/2015    Years since quitting: 4.0  . Smokeless tobacco: Never Used  Substance Use Topics  . Alcohol use: No    Alcohol/week: 0.0 standard drinks    Family History  Problem Relation Age of Onset  . Cancer Mother   . Breast cancer Mother   . Diabetes Father   . Cancer Sister   . Breast cancer Sister   . Alzheimer's disease Paternal Grandmother   . Anuerysm Other        Paternal family Hx      Review of Systems  Constitutional: negative for fatigue and weight loss Respiratory: negative for cough and wheezing Cardiovascular: negative for chest pain, fatigue and palpitations Gastrointestinal: negative  for abdominal pain and change in bowel habits Musculoskeletal:negative for myalgias Neurological: negative for gait problems and tremors Behavioral/Psych: negative for abusive relationship, depression Endocrine: negative for temperature intolerance    Genitourinary:negative for abnormal menstrual periods, genital lesions, hot flashes, sexual problems and vaginal discharge Integument/breast: negative for breast lump, breast tenderness, nipple discharge and skin lesion(s)    Objective:       BP 122/84   Pulse 80   Wt 119 lb (54 kg)   BMI 21.08 kg/m  General:   alert  Skin:   no rash or abnormalities  Lungs:   clear to auscultation bilaterally  Heart:   regular rate and rhythm, S1, S2 normal, no murmur, click, rub or gallop  Breasts:   normal without suspicious masses, skin or nipple changes or axillary nodes  Abdomen:  normal findings: no organomegaly, soft, non-tender and no hernia  Pelvis:  External genitalia: normal general appearance Urinary system: urethral meatus normal and bladder without fullness, nontender Vaginal: normal without tenderness, induration or masses Cervix: normal appearance Adnexa: normal bimanual exam Uterus: anteverted and non-tender, normal size   Lab Review Urine pregnancy test Labs reviewed yes Radiologic studies reviewed no  50% of 25 min visit spent on counseling and coordination of care.   Assessment:     1. Encounter for gynecological examination with Papanicolaou smear of cervix Rx: - Cytology - PAP( [facility])  2. Pelvic pain Rx: - US PELVIC COMPLETE WITH TRANSVAGINAL; Future - oxyCODONE-acetaminophen (PERCOCET/ROXICET) 5-325 MG tablet; Take 1-2 tablets by mouth every 4 (four) hours as needed for severe pain.  Dispense: 30 tablet; Refill: 0  3. S/P endometrial ablation    Plan:    Education reviewed: calcium supplements, depression evaluation, low fat, low cholesterol diet, safe sex/STD prevention, self breast exams and weight  bearing exercise. Follow up in: 2 weeks.   Meds ordered this encounter  Medications  . oxyCODONE-acetaminophen (PERCOCET/ROXICET) 5-325 MG tablet    Sig: Take 1-2 tablets by mouth every 4 (four) hours as needed for severe pain.    Dispense:  30 tablet    Refill:  0   Orders Placed This Encounter  Procedures  . US PELVIC COMPLETE WITH TRANSVAGINAL    [employer]/epic order No Travel/No Covid Wt: 119/No Needs EW/Aneetra 32oz water one prior do not void    Standing Status:   Future    Standing Expiration Date:   01/27/2021    Order Specific Question:   Reason for Exam (SYMPTOM  OR DIAGNOSIS REQUIRED)    Answer:   Pelvic pain    Order Specific Question:   Preferred imaging location?    Answer:   [facility]    Shelly Bombard,  MD 11/30/2019 4:46 PM

## 2019-12-02 LAB — CYTOLOGY - PAP
Comment: NEGATIVE
Diagnosis: NEGATIVE
High risk HPV: NEGATIVE

## 2019-12-09 ENCOUNTER — Ambulatory Visit
Admission: RE | Admit: 2019-12-09 | Discharge: 2019-12-09 | Disposition: A | Discharge status: Home / Self Care | Payer: PRIVATE HEALTH INSURANCE | Source: Ambulatory Visit | Attending: Obstetrics | Admitting: Obstetrics

## 2019-12-09 DIAGNOSIS — R102 Pelvic and perineal pain: Secondary | ICD-10-CM

## 2019-12-14 ENCOUNTER — Ambulatory Visit (INDEPENDENT_AMBULATORY_CARE_PROVIDER_SITE_OTHER): Payer: PRIVATE HEALTH INSURANCE | Admitting: Obstetrics & Gynecology

## 2019-12-14 ENCOUNTER — Other Ambulatory Visit: Payer: Self-pay

## 2019-12-14 ENCOUNTER — Encounter: Payer: Self-pay | Admitting: Obstetrics & Gynecology

## 2019-12-14 VITALS — BP 126/80 | HR 94 | Ht 63.0 in | Wt 119.5 lb

## 2019-12-14 DIAGNOSIS — Z113 Encounter for screening for infections with a predominantly sexual mode of transmission: Secondary | ICD-10-CM

## 2019-12-14 DIAGNOSIS — N898 Other specified noninflammatory disorders of vagina: Secondary | ICD-10-CM

## 2019-12-14 DIAGNOSIS — R102 Pelvic and perineal pain: Secondary | ICD-10-CM | POA: Diagnosis not present

## 2019-12-14 MED ORDER — DOXYCYCLINE HYCLATE 100 MG PO CAPS: 100.0000 mg | ORAL_CAPSULE | Freq: Two times a day (BID) | ORAL | 0 refills | Status: DC

## 2019-12-14 NOTE — Patient Instructions (Signed)
Pelvic Pain, Female Pelvic pain is pain in your lower belly (abdomen), below your belly button and between your hips. The pain may start suddenly (be acute), keep coming back (be recurring), or last a long time (become chronic). Pelvic pain that lasts longer than 6 months is called chronic pelvic pain. There are many causes of pelvic pain. Sometimes the cause of pelvic pain is not known. Follow these instructions at home:   Take over-the-counter and prescription medicines only as told by your doctor.  Rest as told by your doctor.  Do not have sex if it hurts.  Keep a journal of your pelvic pain. Write down: ? When the pain started. ? Where the pain is located. ? What seems to make the pain better or worse, such as food or your period (menstrual cycle). ? Any symptoms you have along with the pain.  Keep all follow-up visits as told by your doctor. This is important. Contact a doctor if:  Medicine does not help your pain.  Your pain comes back.  You have new symptoms.  You have unusual discharge or bleeding from your vagina.  You have a fever or chills.  You are having trouble pooping (constipation).  You have blood in your pee (urine) or poop (stool).  Your pee smells bad.  You feel weak or light-headed. Get help right away if:  You have sudden pain that is very bad.  Your pain keeps getting worse.  You have very bad pain and also have any of these symptoms: ? A fever. ? Feeling sick to your stomach (nausea). ? Throwing up (vomiting). ? Being very sweaty.  You pass out (lose consciousness). Summary  Pelvic pain is pain in your lower belly (abdomen), below your belly button and between your hips.  There are many possible causes of pelvic pain.  Keep a journal of your pelvic pain. This information is not intended to replace advice given to you by your health care provider. Make sure you discuss any questions you have with your health care provider. Document  Revised: 04/29/2018 Document Reviewed: 04/29/2018 Elsevier Patient Education  2020 Elsevier Inc.  

## 2019-12-14 NOTE — Progress Notes (Signed)
Pt reports pelvic pain that goes back in forth between left and right side, states that it feels like her ovaries. Today pt rates pain 6.5/10. Hx of ablation 2018.

## 2019-12-14 NOTE — Progress Notes (Signed)
Patient ID: Erika [name], female   DOB: [birth year], 39 y.o.   MRN: [id number]  Chief Complaint  Patient presents with  . Follow-up    HPI Erika [name] is a 39 y.o. female.  [id number] No LMP recorded. Patient has had an ablation. She returns reporting LLQ pelvic pain and dyspareunia for 3-4 months. Minimal sx of discharge, no bleeding. [place] was done 12/09/19 HPI  Past Medical History:  Diagnosis Date  . Anemia   . Menorrhagia 07/2017    Past Surgical History:  Procedure Laterality Date  . ENDOMETRIAL ABLATION N/A 08/27/2017   Procedure: ENDOMETRIAL ABLATION WITH NOVASURE;  Surgeon: Woodroe Mode, MD;  Location: [facility];  Service: Gynecology;  Laterality: N/A;  . IUD REMOVAL  09/07/2007  . LEEP  04/22/2008  . TUBAL LIGATION  09/07/2007    Family History  Problem Relation Age of Onset  . Cancer Mother   . Breast cancer Mother   . Diabetes Father   . Cancer Sister   . Breast cancer Sister   . Alzheimer's disease Paternal Grandmother   . Anuerysm Other        Paternal family Hx    Social History Social History   Tobacco Use  . Smoking status: Former Smoker    Packs/day: 0.00    Quit date: 11/25/2015    Years since quitting: 4.0  . Smokeless tobacco: Never Used  Substance Use Topics  . Alcohol use: No    Alcohol/week: 0.0 standard drinks  . Drug use: No    Allergies  Allergen Reactions  . Latex Itching    Current Outpatient Medications  Medication Sig Dispense Refill  . ibuprofen (ADVIL) 600 MG tablet Take 1 tablet (600 mg total) by mouth every 6 (six) hours as needed for moderate pain or cramping. 30 tablet 1  . oxyCODONE-acetaminophen (PERCOCET/ROXICET) 5-325 MG tablet Take 1-2 tablets by mouth every 4 (four) hours as needed for severe pain. 30 tablet 0  . doxycycline (VIBRAMYCIN) 100 MG capsule Take 1 capsule (100 mg total) by mouth 2 (two) times daily. 20 capsule 0  . flurbiprofen (ANSAID) 100 MG tablet Take 1 tablet every 8 hours as  needed, maximum 3 tablets in 24 hours 30 tablet 3  . ketorolac (TORADOL) 10 MG tablet Take 1 tablet (10 mg total) by mouth every 6 (six) hours as needed. (Patient not taking: Reported on 12/14/2019) 20 tablet 2  . tinidazole (TINDAMAX) 500 MG tablet Take 2 tablets (1,000 mg total) by mouth daily with breakfast. 10 tablet 2  . topiramate (TOPAMAX) 100 MG tablet TAKE 1 TABLET BY MOUTH EVERYDAY AT BEDTIME 90 tablet 1  . topiramate (TOPAMAX) 50 MG tablet Take 1 tablet (50 mg total) by mouth at bedtime. 30 tablet 3   No current facility-administered medications for this visit.    Review of Systems Review of Systems  Constitutional: Negative.   Respiratory: Negative.   Gastrointestinal: Negative for constipation and rectal pain.  Genitourinary: Positive for dyspareunia and pelvic pain. Negative for frequency, menstrual problem and vaginal bleeding.    Blood pressure 126/80, pulse 94, height 5\' 3"  (1.6 m), weight 119 lb 8 oz (54.2 kg).  Physical Exam NAD Nl breathing effort Abd ND Pelvic nl EBUS, slight discharge, CMY pos, no mass, L adnexa tender, nl R adnexa Data Reviewed Study Result  CLINICAL DATA:  Pelvic pain, LMP in 2017  EXAM: [place]: Both transabdominal and transvaginal ultrasound examinations of the  pelvis were performed. Transabdominal technique was performed for global imaging of the pelvis including uterus, ovaries, adnexal regions, and pelvic cul-de-sac. It was necessary to proceed with endovaginal exam following the transabdominal exam to visualize the endometrium, and to characterize a RIGHT ovarian lesion.  COMPARISON:  07/28/2018  FINDINGS: Uterus  Measurements: 6.6 x 4.2 x 4.2 cm = volume: 62 mL. Heterogeneous myometrium. Anteverted. Small intramural leiomyoma posteriorly 14 x 11 x 13 mm. No additional masses.  Endometrium  Thickness: 5 mm.  No endometrial fluid or focal abnormality  Right  ovary  Measurements: 4.8 x 2.9 x 3.3 cm = volume: 23.7 mL. Simple cyst within RIGHT ovary 2.0 x 2.0 x 1.9 cm. No additional masses.  Left ovary  Measurements: 3.8 x 2.0 x 1.8 cm = volume: 7.1 mL. Normal morphology without mass  Other findings  No free pelvic fluid.  No additional adnexal masses.  IMPRESSION: Small intramural leiomyoma posterior uterus 14 mm diameter.  2.0 cm diameter simple cyst RIGHT ovary.  Remainder of exam unremarkable.   Electronically Signed   By: Lavonia Dana M.D.   On: 12/09/2019 14:30     Assessment Pelvic pain and tenderness, no significant US findings STD test pending Plan Doxycycline for 10 days RTC 4 weeks if pain not resolved     Emeterio Reeve 12/14/2019, 9:00 AM

## 2019-12-14 NOTE — Progress Notes (Signed)
Patient presents for Follow-up

## 2019-12-15 LAB — CERVICOVAGINAL ANCILLARY ONLY
Bacterial Vaginitis (gardnerella): NEGATIVE
Candida Glabrata: NEGATIVE
Candida Vaginitis: NEGATIVE
Chlamydia: NEGATIVE
Comment: NEGATIVE
Comment: NEGATIVE
Comment: NEGATIVE
Comment: NEGATIVE
Comment: NEGATIVE
Comment: NORMAL
Neisseria Gonorrhea: NEGATIVE
Trichomonas: NEGATIVE

## 2019-12-17 ENCOUNTER — Telehealth: Payer: Self-pay | Admitting: *Deleted

## 2019-12-17 NOTE — Telephone Encounter (Signed)
Pt called to office asking for another form of pain medication. Pt states she is still taking antibiotic but still having pelvic pain, little to no relief with Ibuprofen.  Pt advised she may take Ibuprofen and alternate with extra strength Tylenol or she may want to try 2 Aleve per day and see if this will help pain she is having.  Pt made aware message to be sent to provider today for any other recommendation / new Rx.   Please advise or send new Rx for pain.

## 2019-12-20 NOTE — Telephone Encounter (Signed)
If pain gets worse return to be seen. I agree with the recommendation for ibuprofen and Tylenol for her pain.

## 2019-12-22 ENCOUNTER — Ambulatory Visit: Payer: Managed Care, Other (non HMO) | Attending: Internal Medicine

## 2019-12-22 ENCOUNTER — Encounter: Payer: Self-pay | Admitting: *Deleted

## 2019-12-22 DIAGNOSIS — Z20822 Contact with and (suspected) exposure to covid-19: Secondary | ICD-10-CM | POA: Insufficient documentation

## 2019-12-22 NOTE — Telephone Encounter (Signed)
My chart message sent to pt.

## 2019-12-23 LAB — NOVEL CORONAVIRUS, NAA: SARS-CoV-2, NAA: NOT DETECTED

## 2019-12-30 ENCOUNTER — Encounter: Payer: Self-pay | Admitting: Obstetrics & Gynecology

## 2019-12-30 ENCOUNTER — Ambulatory Visit (INDEPENDENT_AMBULATORY_CARE_PROVIDER_SITE_OTHER): Payer: Managed Care, Other (non HMO) | Admitting: Obstetrics & Gynecology

## 2019-12-30 ENCOUNTER — Other Ambulatory Visit: Payer: Self-pay

## 2019-12-30 VITALS — BP 108/71 | HR 81 | Wt 120.0 lb

## 2019-12-30 DIAGNOSIS — R102 Pelvic and perineal pain: Secondary | ICD-10-CM

## 2019-12-30 MED ORDER — MEDROXYPROGESTERONE ACETATE 10 MG PO TABS: 10.0000 mg | ORAL_TABLET | Freq: Every day | ORAL | 2 refills | Status: DC

## 2019-12-30 MED ORDER — DICLOFENAC SODIUM 75 MG PO TBEC: 75.0000 mg | DELAYED_RELEASE_TABLET | Freq: Two times a day (BID) | ORAL | 2 refills | Status: DC

## 2019-12-30 NOTE — Patient Instructions (Signed)

## 2019-12-30 NOTE — Progress Notes (Signed)
Patient ID: Erika [name], female   DOB: May 14, 1981, 39 y.o.   MRN: [id number]  Chief Complaint  Patient presents with  . Follow-up    cramping has worsened on the left side     HPI Erika [name] is a 39 y.o. female.  [id number] No LMP recorded. Patient has had an ablation. No bleeding, pain is 5 out of 7 days and radiates from the left pelvis to the left lower back, worse with walking HPI  Past Medical History:  Diagnosis Date  . Anemia   . Menorrhagia 07/2017    Past Surgical History:  Procedure Laterality Date  . ENDOMETRIAL ABLATION N/A 08/27/2017   Procedure: ENDOMETRIAL ABLATION WITH NOVASURE;  Surgeon: Woodroe Mode, MD;  Location: [facility];  Service: Gynecology;  Laterality: N/A;  . IUD REMOVAL  09/07/2007  . LEEP  04/22/2008  . TUBAL LIGATION  09/07/2007    Family History  Problem Relation Age of Onset  . Cancer Mother   . Breast cancer Mother   . Diabetes Father   . Cancer Sister   . Breast cancer Sister   . Alzheimer's disease Paternal Grandmother   . Anuerysm Other        Paternal family Hx    Social History Social History   Tobacco Use  . Smoking status: Former Smoker    Packs/day: 0.00    Quit date: 11/25/2015    Years since quitting: 4.0  . Smokeless tobacco: Never Used  Substance Use Topics  . Alcohol use: No    Alcohol/week: 0.0 standard drinks  . Drug use: No    Allergies  Allergen Reactions  . Latex Itching    Current Outpatient Medications  Medication Sig Dispense Refill  . ibuprofen (ADVIL) 600 MG tablet Take 1 tablet (600 mg total) by mouth every 6 (six) hours as needed for moderate pain or cramping. 30 tablet 1  . oxyCODONE-acetaminophen (PERCOCET/ROXICET) 5-325 MG tablet Take 1-2 tablets by mouth every 4 (four) hours as needed for severe pain. 30 tablet 0  . diclofenac (VOLTAREN) 75 MG EC tablet Take 1 tablet (75 mg total) by mouth 2 (two) times daily with a meal. 60 tablet 2  . doxycycline (VIBRAMYCIN) 100 MG  capsule Take 1 capsule (100 mg total) by mouth 2 (two) times daily. (Patient not taking: Reported on 12/30/2019) 20 capsule 0  . ketorolac (TORADOL) 10 MG tablet Take 1 tablet (10 mg total) by mouth every 6 (six) hours as needed. (Patient not taking: Reported on 12/14/2019) 20 tablet 2  . medroxyPROGESTERone (PROVERA) 10 MG tablet Take 1 tablet (10 mg total) by mouth daily. 30 tablet 2   No current facility-administered medications for this visit.    Review of Systems Review of Systems  Genitourinary: Positive for pelvic pain. Negative for difficulty urinating, dysuria, frequency, menstrual problem and vaginal discharge.  Musculoskeletal: Positive for back pain.    Blood pressure 108/71, pulse 81, weight 120 lb (54.4 kg).  Physical Exam Physical Exam  Data Reviewed [place] result previously reviewed  Assessment Patient Active Problem List   Diagnosis Date Noted  . Family history of breast cancer in mother 07/21/2017  . Pelvic pain in female 07/21/2017  Back pain suspicious for MS origin, pain not cyclic, negative eval for definitive Gyn origin   Plan Voltaren for pain PT evaluation requested  Cycle suppression trial with Provera RTC 4 weeks    Emeterio Reeve 12/30/2019, 9:55 AM

## 2020-01-18 ENCOUNTER — Ambulatory Visit: Payer: Self-pay | Admitting: Obstetrics & Gynecology

## 2020-02-13 ENCOUNTER — Emergency Department (HOSPITAL_COMMUNITY): Payer: 59

## 2020-02-13 ENCOUNTER — Other Ambulatory Visit: Payer: Self-pay

## 2020-02-13 ENCOUNTER — Encounter (HOSPITAL_COMMUNITY): Payer: Self-pay

## 2020-02-13 ENCOUNTER — Emergency Department (HOSPITAL_COMMUNITY)
Admission: EM | Admit: 2020-02-13 | Discharge: 2020-02-14 | Disposition: A | Discharge status: Home / Self Care | Payer: 59 | Attending: Emergency Medicine | Admitting: Emergency Medicine

## 2020-02-13 DIAGNOSIS — R1032 Left lower quadrant pain: Secondary | ICD-10-CM | POA: Diagnosis present

## 2020-02-13 DIAGNOSIS — Z91040 Latex allergy status: Secondary | ICD-10-CM | POA: Diagnosis not present

## 2020-02-13 DIAGNOSIS — Z87891 Personal history of nicotine dependence: Secondary | ICD-10-CM | POA: Insufficient documentation

## 2020-02-13 LAB — URINALYSIS, ROUTINE W REFLEX MICROSCOPIC
Bilirubin Urine: NEGATIVE
Glucose, UA: NEGATIVE mg/dL
Hgb urine dipstick: NEGATIVE
Ketones, ur: 5 mg/dL — AB
Leukocytes,Ua: NEGATIVE
Nitrite: NEGATIVE
Protein, ur: NEGATIVE mg/dL
Specific Gravity, Urine: 1.026 (ref 1.005–1.030)
pH: 6 (ref 5.0–8.0)

## 2020-02-13 LAB — CBC WITH DIFFERENTIAL/PLATELET
Abs Immature Granulocytes: 0.03 10*3/uL (ref 0.00–0.07)
Basophils Absolute: 0.1 10*3/uL (ref 0.0–0.1)
Basophils Relative: 1 %
Eosinophils Absolute: 0.5 10*3/uL (ref 0.0–0.5)
Eosinophils Relative: 4 %
HCT: 38.9 % (ref 36.0–46.0)
Hemoglobin: 12.9 g/dL (ref 12.0–15.0)
Immature Granulocytes: 0 %
Lymphocytes Relative: 21 %
Lymphs Abs: 2.3 10*3/uL (ref 0.7–4.0)
MCH: 23.2 pg — ABNORMAL LOW (ref 26.0–34.0)
MCHC: 33.2 g/dL (ref 30.0–36.0)
MCV: 70 fL — ABNORMAL LOW (ref 80.0–100.0)
Monocytes Absolute: 0.8 10*3/uL (ref 0.1–1.0)
Monocytes Relative: 7 %
Neutro Abs: 7.4 10*3/uL (ref 1.7–7.7)
Neutrophils Relative %: 67 %
Platelets: 370 10*3/uL (ref 150–400)
RBC: 5.56 MIL/uL — ABNORMAL HIGH (ref 3.87–5.11)
RDW: 14.3 % (ref 11.5–15.5)
WBC: 11.1 10*3/uL — ABNORMAL HIGH (ref 4.0–10.5)
nRBC: 0 % (ref 0.0–0.2)

## 2020-02-13 LAB — COMPREHENSIVE METABOLIC PANEL
ALT: 9 U/L (ref 0–44)
AST: 16 U/L (ref 15–41)
Albumin: 4.2 g/dL (ref 3.5–5.0)
Alkaline Phosphatase: 52 U/L (ref 38–126)
Anion gap: 10 (ref 5–15)
BUN: 11 mg/dL (ref 6–20)
CO2: 26 mmol/L (ref 22–32)
Calcium: 8.9 mg/dL (ref 8.9–10.3)
Chloride: 105 mmol/L (ref 98–111)
Creatinine, Ser: 0.81 mg/dL (ref 0.44–1.00)
GFR calc Af Amer: >60 mL/min (ref >60)
GFR calc non Af Amer: >60 mL/min (ref >60)
Glucose, Bld: 84 mg/dL (ref 70–99)
Potassium: 4 mmol/L (ref 3.5–5.1)
Sodium: 141 mmol/L (ref 135–145)
Total Bilirubin: 0.6 mg/dL (ref 0.3–1.2)
Total Protein: 6.8 g/dL (ref 6.5–8.1)

## 2020-02-13 LAB — WET PREP, GENITAL
Clue Cells Wet Prep HPF POC: NONE SEEN
Sperm: NONE SEEN
Trich, Wet Prep: NONE SEEN
Yeast Wet Prep HPF POC: NONE SEEN

## 2020-02-13 LAB — LIPASE, BLOOD: Lipase: 21 U/L (ref 11–51)

## 2020-02-13 MED ORDER — ONDANSETRON HCL 4 MG/2ML IJ SOLN
4.0000 mg | Freq: Once | INTRAMUSCULAR | Status: AC
  Administered 2020-02-14: 4 mg via INTRAVENOUS
  Filled 2020-02-13: qty 2

## 2020-02-13 MED ORDER — SODIUM CHLORIDE 0.9 % IV BOLUS
1000.0000 mL | Freq: Once | INTRAVENOUS | Status: AC
  Administered 2020-02-13: 1000 mL via INTRAVENOUS

## 2020-02-13 MED ORDER — HYDROMORPHONE HCL 1 MG/ML IJ SOLN
1.0000 mg | Freq: Once | INTRAMUSCULAR | Status: AC
  Administered 2020-02-13: 1 mg via INTRAVENOUS
  Filled 2020-02-13: qty 1

## 2020-02-13 NOTE — ED Provider Notes (Signed)
[facility] DEPT Provider Note   CSN: [id number] Arrival date & time: 02/13/20  2100     History Chief Complaint  Patient presents with  . Abdominal Pain    Erika [name] is a 39 y.o. female.  HPI 39 year old female presents with left lower quadrant abdominal pain.  Has been having this pain on and off for a year or more.  Has seen her OB/GYN.  For the past 3 days it has been hurting but then over the last 3 hours or so the pain has worsened and become intractable.  No back pain.  Has vomited twice, she thinks this is due to the degree of pain.  No vaginal symptoms or urinary symptoms.  No fevers.  Has tried NSAIDs with no relief.   Past Medical History:  Diagnosis Date  . Anemia   . Menorrhagia 07/2017    Patient Active Problem List   Diagnosis Date Noted  . Family history of breast cancer in mother 07/21/2017  . Pelvic pain in female 07/21/2017    Past Surgical History:  Procedure Laterality Date  . ENDOMETRIAL ABLATION N/A 08/27/2017   Procedure: ENDOMETRIAL ABLATION WITH NOVASURE;  Surgeon: Woodroe Mode, MD;  Location: [place] [place];  Service: Gynecology;  Laterality: N/A;  . IUD REMOVAL  09/07/2007  . LEEP  04/22/2008  . TUBAL LIGATION  09/07/2007     OB History    Gravida  3   Para  2   Term  2   Preterm      AB  1   Living  2     SAB  1   TAB      Ectopic      Multiple      Live Births  2           Family History  Problem Relation Age of Onset  . Cancer Mother   . Breast cancer Mother   . Diabetes Father   . Cancer Sister   . Breast cancer Sister   . Alzheimer's disease Paternal Grandmother   . Anuerysm Other        Paternal family Hx    Social History   Tobacco Use  . Smoking status: Former Smoker    Packs/day: 0.00    Quit date: 11/25/2015    Years since quitting: 4.2  . Smokeless tobacco: Never Used  Substance Use Topics  . Alcohol use: No    Alcohol/week: 0.0 standard  drinks  . Drug use: No    Home Medications Prior to Admission medications   Medication Sig Start Date End Date Taking? Authorizing Provider  acetaminophen (TYLENOL) 500 MG tablet Take 1,000 mg by mouth every 6 (six) hours as needed for mild pain or moderate pain.   Yes [provider]  diclofenac (VOLTAREN) 75 MG EC tablet Take 1 tablet (75 mg total) by mouth 2 (two) times daily with a meal. 12/30/19  Yes Woodroe Mode, MD  ibuprofen (ADVIL) 600 MG tablet Take 1 tablet (600 mg total) by mouth every 6 (six) hours as needed for moderate pain or cramping. 11/23/19  Yes Truett Mainland, DO  medroxyPROGESTERone (PROVERA) 10 MG tablet Take 1 tablet (10 mg total) by mouth daily. 12/30/19  Yes Woodroe Mode, MD  doxycycline (VIBRAMYCIN) 100 MG capsule Take 1 capsule (100 mg total) by mouth 2 (two) times daily. Patient not taking: Reported on 12/30/2019 12/14/19   Woodroe Mode, MD  ketorolac (TORADOL)  10 MG tablet Take 1 tablet (10 mg total) by mouth every 6 (six) hours as needed. Patient not taking: Reported on 12/14/2019 12/29/18   Shelly Bombard, MD  oxyCODONE-acetaminophen (PERCOCET/ROXICET) 5-325 MG tablet Take 1-2 tablets by mouth every 4 (four) hours as needed for severe pain. Patient not taking: Reported on 02/13/2020 11/30/19   Shelly Bombard, MD    Allergies    Latex  Review of Systems   Review of Systems  Constitutional: Negative for fever.  Gastrointestinal: Positive for abdominal pain and vomiting.  Genitourinary: Negative for dysuria, vaginal bleeding and vaginal discharge.  Musculoskeletal: Negative for back pain.  All other systems reviewed and are negative.   Physical Exam Updated Vital Signs BP (!) 155/93   Pulse 80   Temp 98.5 F (36.9 C)   Resp 18   Ht 5\' 3"  (1.6 m)   Wt 53.5 kg   SpO2 100%   BMI 20.90 kg/m   Physical Exam Vitals and nursing note reviewed. Exam conducted with a chaperone present.  Constitutional:      Appearance: She is  well-developed.  HENT:     Head: Normocephalic and atraumatic.     Right Ear: External ear normal.     Left Ear: External ear normal.     Nose: Nose normal.  Eyes:     General:        Right eye: No discharge.        Left eye: No discharge.  Cardiovascular:     Rate and Rhythm: Normal rate and regular rhythm.     Heart sounds: Normal heart sounds.  Pulmonary:     Effort: Pulmonary effort is normal.     Breath sounds: Normal breath sounds.  Abdominal:     Palpations: Abdomen is soft.     Tenderness: There is abdominal tenderness in the left lower quadrant. There is no right CVA tenderness or left CVA tenderness.  Genitourinary:    Vagina: No bleeding.     Cervix: No cervical motion tenderness or cervical bleeding.     Uterus: Not tender.      Adnexa:        Right: No mass.         Left: No mass.    Skin:    General: Skin is warm and dry.  Neurological:     Mental Status: She is alert.  Psychiatric:        Mood and Affect: Mood is not anxious.     ED Results / Procedures / Treatments   Labs (all labs ordered are listed, but only abnormal results are displayed) Labs Reviewed  WET PREP, GENITAL - Abnormal; Notable for the following components:      Result Value   WBC, Wet Prep HPF POC FEW (*)    All other components within normal limits  CBC WITH DIFFERENTIAL/PLATELET - Abnormal; Notable for the following components:   WBC 11.1 (*)    RBC 5.56 (*)    MCV 70.0 (*)    MCH 23.2 (*)    All other components within normal limits  URINALYSIS, ROUTINE W REFLEX MICROSCOPIC - Abnormal; Notable for the following components:   Ketones, ur 5 (*)    All other components within normal limits  COMPREHENSIVE METABOLIC PANEL  LIPASE, BLOOD  PREGNANCY, URINE  RPR  HIV ANTIBODY (ROUTINE TESTING W REFLEX)  GC/CHLAMYDIA PROBE AMP ([place]) NOT AT [facility]    EKG None  Radiology US PELVIC COMPLETE W TRANSVAGINAL AND TORSION  R/O  Result Date: 02/13/2020 CLINICAL DATA:  Left lower  quadrant pain. EXAM: TRANSABDOMINAL AND TRANSVAGINAL ULTRASOUND OF PELVIS TECHNIQUE: Both transabdominal and transvaginal ultrasound examinations of the pelvis were performed. Transabdominal technique was performed for global imaging of the pelvis including uterus, ovaries, adnexal regions, and pelvic cul-de-sac. It was necessary to proceed with endovaginal exam following the transabdominal exam to visualize the uterus, endometrium and bilateral ovaries. COMPARISON:  None FINDINGS: Uterus Measurements: 7.9 cm x 4.7 cm x 5.0 cm = volume: 96.8 mL. 1.8 cm x 1.3 cm x 1.7 cm and 1.9 cm x 1.3 cm x 1.4 cm heterogeneous uterine masses are seen. An additional 1.3 cm x 1.3 cm x 1.4 cm heterogeneous uterine mass is noted. These areas are all seen near the uterine fundus. Endometrium Thickness: 2.4 mm.  No focal abnormality visualized. Right ovary Measurements: 2.3 cm x 1.9 cm x 2.7 cm = volume: 63 mL. There is normal right ovarian flow. A 1.3 cm x 1.2 cm x 1.4 cm complex appearing anechoic structure is seen within the right ovary. No flow is seen within this region on color Doppler evaluation. Left ovary Measurements: 3.8 cm x 2.7 cm x 3.2 cm = volume: 17.5 mL. There is normal left ovarian flow. A 3.0 cm x 2.2 cm x 2.7 cm anechoic structure is seen within the left ovary. No flow is seen within this region on color Doppler evaluation. Other findings A trace amount of free fluid is seen. IMPRESSION: 1. Findings likely consistent with multiple heterogeneous uterine fibroids. 2. Bilateral ovarian cysts. Electronically Signed   By: Virgina Norfolk M.D.   On: 02/13/2020 23:37    Procedures Procedures (including critical care time)  Medications Ordered in ED Medications  sodium chloride (PF) 0.9 % injection (has no administration in time range)  HYDROmorphone (DILAUDID) injection 1 mg (1 mg Intravenous Given 02/13/20 2235)  sodium chloride 0.9 % bolus 1,000 mL (0 mLs Intravenous Stopped 02/13/20 2357)  ondansetron (ZOFRAN)  injection 4 mg (4 mg Intravenous Given 02/14/20 0020)    ED Course  I have reviewed the triage vital signs and the nursing notes.  Pertinent labs & imaging results that were available during my care of the patient were reviewed by me and considered in my medical decision making (see chart for details).    MDM Rules/Calculators/A&P                      Pelvic exam did not show significant left lower quadrant tenderness but she also has much better pain control after the IV Dilaudid.  Ultrasound does not show obvious torsion.  Discussed options with patient, will get CT given the length of time of her symptoms overall and the acute worsening tonight.  If negative I think she can be discharged. Care to Dr. Tomi Bamberger. Final Clinical Impression(s) / ED Diagnoses Final diagnoses:  LLQ pain    Rx / DC Orders ED Discharge Orders    None       Sherwood Gambler, MD 02/14/20 0028

## 2020-02-13 NOTE — ED Triage Notes (Signed)
Pt presents with LLQ pain that has been going on x3 days that increased in pain tonight. Pt denies V/D. Las BM was yesterday and was normal. Pt states she has no trouble with urination and no urinary hx. Pt had a ablation and tubal ligation. Pt reports Nausea, believes it from the pain. Pt is A&Ox4.

## 2020-02-14 ENCOUNTER — Emergency Department (HOSPITAL_COMMUNITY): Payer: 59

## 2020-02-14 LAB — HIV ANTIBODY (ROUTINE TESTING W REFLEX): HIV Screen 4th Generation wRfx: NONREACTIVE

## 2020-02-14 LAB — RPR: RPR Ser Ql: NONREACTIVE

## 2020-02-14 LAB — PREGNANCY, URINE: Preg Test, Ur: NEGATIVE

## 2020-02-14 MED ORDER — SODIUM CHLORIDE (PF) 0.9 % IJ SOLN
INTRAMUSCULAR | Status: AC
  Filled 2020-02-14: qty 50

## 2020-02-14 MED ORDER — IOHEXOL 300 MG/ML  SOLN
100.0000 mL | Freq: Once | INTRAMUSCULAR | Status: AC | PRN
  Administered 2020-02-14: 100 mL via INTRAVENOUS

## 2020-02-14 MED ORDER — DICYCLOMINE HCL 20 MG PO TABS: 20.0000 mg | ORAL_TABLET | Freq: Three times a day (TID) | ORAL | 0 refills | Status: DC

## 2020-02-14 NOTE — Discharge Instructions (Addendum)
Start the Bentyl and see if that helps with your abdominal pain.  Please call [facility] to get an appointment to evaluate your abdominal pain.  Return to the emergency department if you get fever, uncontrolled vomiting, or severe pain.

## 2020-02-14 NOTE — ED Provider Notes (Signed)
Patient left at change of shift to get the results of abdominal/pelvis CT scan. Patient reports she has had this pain on her left side, usually in the left lower quadrant.  She states sometimes she can feel her intestines moving around on her left side.  She states her OB/GYN's do not feel like her pain is from her ovaries.  She was aware of the fibroids and states she had a uterine ablation done due to heavy periods.  She states she has not had a referral to gastroenterology which I will do tonight.  We discussed taking Bentyl to see if that will help with her discomfort.  She denies being constipated.  She also states when she had her pelvic exam that it was not painful.     CT ABDOMEN PELVIS W CONTRAST  Result Date: 02/14/2020 CLINICAL DATA:  Left lower quadrant pain EXAM: CT ABDOMEN AND PELVIS WITH CONTRAST TECHNIQUE: Multidetector CT imaging of the abdomen and pelvis was performed using the standard protocol following bolus administration of intravenous contrast. CONTRAST:  144mL OMNIPAQUE IOHEXOL 300 MG/ML  SOLN COMPARISON:  Pelvic ultrasound same day FINDINGS: Lower chest: The visualized heart size within normal limits. No pericardial fluid/thickening. No hiatal hernia. The visualized portions of the lungs are clear. Hepatobiliary: There is a tiny 4 mm hypodense lesion seen within the right hepatic lobe and a 5 mm low-density lesion in the posterior right liver lobe, series 2, image 18.the main portal vein is patent. No evidence of calcified gallstones, gallbladder wall thickening or biliary dilatation. Pancreas: Unremarkable. No pancreatic ductal dilatation or surrounding inflammatory changes. Spleen: Normal in size without focal abnormality. Adrenals/Urinary Tract: Both adrenal glands appear normal. There is a 1 cm low-density lesion seen in the lower pole of the right kidney. The left kidney is unremarkable. The bladder is unremarkable. Stomach/Bowel: The stomach, small bowel, and colon are normal  in appearance. No inflammatory changes, wall thickening, or obstructive findings.A moderate amount of colonic stool is present. Vascular/Lymphatic: There are no enlarged mesenteric, retroperitoneal, or pelvic lymph nodes. No significant vascular findings are present. Reproductive: Multiple hypodense lesions are seen throughout the uterus, likely uterine fibroids. Bilateral low-density lesions are seen within the ovaries the largest measuring 2.5 cm in the left ovary. A small amount of free fluid is seen within the cul-de-sac. Other: No evidence of abdominal wall mass or hernia. Musculoskeletal: No acute or significant osseous findings. IMPRESSION: Moderate amount of colonic stool without evidence of obstruction. No other acute intra-abdominal pathology to explain the patient's symptoms. Probable multiple uterine fibroids and bilateral ovarian cysts as on pelvic ultrasound. Electronically Signed   By: Prudencio Pair M.D.   On: 02/14/2020 01:05   US PELVIC COMPLETE W TRANSVAGINAL AND TORSION R/O  Result Date: 02/13/2020 CLINICAL DATA:  Left lower quadrant pain.IMPRESSION: 1. Findings likely consistent with multiple heterogeneous uterine fibroids. 2. Bilateral ovarian cysts. Electronically Signed   By: Virgina Norfolk M.D.   On: 02/13/2020 23:37   Diagnoses that have been ruled out:  None  Diagnoses that are still under consideration:  None  Final diagnoses:  Left lower quadrant abdominal pain   ED Discharge Orders         Ordered    dicyclomine (BENTYL) 20 MG tablet  3 times daily before meals & bedtime     02/14/20 0207         Plan discharge  Rolland Porter, MD, Barbette Or, MD 02/14/20 [phone]

## 2020-02-15 LAB — GC/CHLAMYDIA PROBE AMP (~~LOC~~) NOT AT ARMC
Chlamydia: NEGATIVE
Neisseria Gonorrhea: NEGATIVE

## 2020-05-28 ENCOUNTER — Other Ambulatory Visit: Payer: Self-pay | Admitting: Obstetrics & Gynecology

## 2020-05-28 DIAGNOSIS — R102 Pelvic and perineal pain: Secondary | ICD-10-CM

## 2020-08-29 ENCOUNTER — Other Ambulatory Visit (HOSPITAL_COMMUNITY)
Admission: RE | Admit: 2020-08-29 | Discharge: 2020-08-29 | Disposition: A | Discharge status: Home / Self Care | Payer: Managed Care, Other (non HMO) | Source: Ambulatory Visit | Attending: Obstetrics and Gynecology | Admitting: Obstetrics and Gynecology

## 2020-08-29 ENCOUNTER — Ambulatory Visit: Payer: Managed Care, Other (non HMO)

## 2020-08-29 ENCOUNTER — Other Ambulatory Visit: Payer: Self-pay

## 2020-08-29 DIAGNOSIS — N898 Other specified noninflammatory disorders of vagina: Secondary | ICD-10-CM

## 2020-08-29 NOTE — Progress Notes (Signed)
Pt is in the office for self swab, reports vaginal odor, itching, burning and discharge.

## 2020-08-31 LAB — CERVICOVAGINAL ANCILLARY ONLY
Bacterial Vaginitis (gardnerella): POSITIVE — AB
Candida Glabrata: NEGATIVE
Candida Vaginitis: POSITIVE — AB
Chlamydia: NEGATIVE
Comment: NEGATIVE
Comment: NEGATIVE
Comment: NEGATIVE
Comment: NEGATIVE
Comment: NEGATIVE
Comment: NORMAL
Neisseria Gonorrhea: NEGATIVE
Trichomonas: NEGATIVE

## 2020-08-31 NOTE — Progress Notes (Signed)
Patient was assessed and managed by nursing staff during this encounter. I have reviewed the chart and agree with the documentation and plan. I have also made any necessary editorial changes.  Verita Schneiders, MD 08/31/2020 11:37 AM

## 2020-09-01 ENCOUNTER — Other Ambulatory Visit: Payer: Self-pay | Admitting: Obstetrics & Gynecology

## 2020-09-01 DIAGNOSIS — B9689 Other specified bacterial agents as the cause of diseases classified elsewhere: Secondary | ICD-10-CM

## 2020-09-01 DIAGNOSIS — B373 Candidiasis of vulva and vagina: Secondary | ICD-10-CM

## 2020-09-01 DIAGNOSIS — B3731 Acute candidiasis of vulva and vagina: Secondary | ICD-10-CM

## 2020-09-01 DIAGNOSIS — N760 Acute vaginitis: Secondary | ICD-10-CM

## 2020-09-01 MED ORDER — FLUCONAZOLE 150 MG PO TABS: 150.0000 mg | ORAL_TABLET | Freq: Once | ORAL | 3 refills | Status: AC

## 2020-09-01 MED ORDER — METRONIDAZOLE 500 MG PO TABS: 500.0000 mg | ORAL_TABLET | Freq: Two times a day (BID) | ORAL | 0 refills | Status: AC

## 2021-02-16 ENCOUNTER — Telehealth: Payer: Self-pay

## 2021-02-16 NOTE — Telephone Encounter (Signed)
Pt left voicemail states she had an ablation in 2017 and now she is spotting.  LVM for pt to contact the office

## 2021-03-22 ENCOUNTER — Encounter: Payer: Self-pay | Admitting: Obstetrics

## 2021-03-22 ENCOUNTER — Ambulatory Visit (INDEPENDENT_AMBULATORY_CARE_PROVIDER_SITE_OTHER): Payer: BC Managed Care – PPO | Admitting: Obstetrics

## 2021-03-22 ENCOUNTER — Other Ambulatory Visit: Payer: Self-pay

## 2021-03-22 ENCOUNTER — Other Ambulatory Visit (HOSPITAL_COMMUNITY)
Admission: RE | Admit: 2021-03-22 | Discharge: 2021-03-22 | Disposition: A | Discharge status: Home / Self Care | Payer: BC Managed Care – PPO | Source: Ambulatory Visit | Attending: Obstetrics | Admitting: Obstetrics

## 2021-03-22 VITALS — BP 117/81 | HR 76 | Ht 64.0 in | Wt 122.0 lb

## 2021-03-22 DIAGNOSIS — Z01419 Encounter for gynecological examination (general) (routine) without abnormal findings: Secondary | ICD-10-CM | POA: Diagnosis not present

## 2021-03-22 DIAGNOSIS — N898 Other specified noninflammatory disorders of vagina: Secondary | ICD-10-CM

## 2021-03-22 DIAGNOSIS — Z113 Encounter for screening for infections with a predominantly sexual mode of transmission: Secondary | ICD-10-CM | POA: Diagnosis not present

## 2021-03-22 DIAGNOSIS — Z1239 Encounter for other screening for malignant neoplasm of breast: Secondary | ICD-10-CM

## 2021-03-22 DIAGNOSIS — F17200 Nicotine dependence, unspecified, uncomplicated: Secondary | ICD-10-CM

## 2021-03-22 DIAGNOSIS — R87612 Low grade squamous intraepithelial lesion on cytologic smear of cervix (LGSIL): Secondary | ICD-10-CM | POA: Diagnosis not present

## 2021-03-22 LAB — CERVICOVAGINAL ANCILLARY ONLY
Bacterial Vaginitis (gardnerella): NEGATIVE
Candida Glabrata: NEGATIVE
Candida Vaginitis: NEGATIVE
Chlamydia: NEGATIVE
Comment: NEGATIVE
Comment: NEGATIVE
Comment: NEGATIVE
Comment: NEGATIVE
Comment: NEGATIVE
Comment: NORMAL
Neisseria Gonorrhea: NEGATIVE
Trichomonas: NEGATIVE

## 2021-03-22 NOTE — Progress Notes (Signed)
Pt has some vaginal irritation and burning.   Pt had ablation in 2017- has been doing fine but recently had some spotting.   Pt would like std screen today.

## 2021-03-22 NOTE — Progress Notes (Addendum)
Subjective:        Erika [name] is a 40 y.o. female here for a routine exam.  Current complaints: Vaginal spotting x 1.  Vaginal discharge.   Personal health questionnaire:  Is patient [name], have a family history of breast and/or ovarian cancer: yes, mother and sister - breast CA.  Neg BRCA testing Is there a family history of uterine cancer diagnosed at age < 67, gastrointestinal cancer, urinary tract cancer, family member who is a Field seismologist syndrome-associated carrier: no Is the patient overweight and hypertensive, family history of diabetes, personal history of gestational diabetes, preeclampsia or PCOS: no Is patient over 44, have PCOS,  family history of premature CHD under age 50, diabetes, smoke, have hypertension or peripheral artery disease:  no At any time, has a partner hit, kicked or otherwise hurt or frightened you?: no Over the past 2 weeks, have you felt down, depressed or hopeless?: no Over the past 2 weeks, have you felt little interest or pleasure in doing things?:no   Gynecologic History No LMP recorded. Patient has had an ablation. Contraception: tubal ligation Last Pap: 11-30-2019. Results were: normal Last mammogram: 07-25-2017. Results were: normal  Obstetric History OB History  Gravida Para Term Preterm AB Living  $Remov'3 2 2   1 2  'DRNgyW$ SAB IAB Ectopic Multiple Live Births  1       2    # Outcome Date GA Lbr Len/2nd Weight Sex Delivery Anes PTL Lv  3 Term 07/06/03 [redacted]w[redacted]d  6 lb 8 oz (2.948 kg) M Vag-Spont None  LIV  2 Term 09/26/98 [redacted]w[redacted]d  6 lb 4 oz (2.835 kg) F Vag-Spont None  LIV  1 SAB             Past Medical History:  Diagnosis Date  . Anemia   . Menorrhagia 07/2017    Past Surgical History:  Procedure Laterality Date  . ENDOMETRIAL ABLATION N/A 08/27/2017   Procedure: ENDOMETRIAL ABLATION WITH NOVASURE;  Surgeon: Woodroe Mode, MD;  Location: [facility];  Service: Gynecology;  Laterality: N/A;  . IUD REMOVAL  09/07/2007  .  LEEP  04/22/2008  . TUBAL LIGATION  09/07/2007    No current outpatient medications on file. Allergies  Allergen Reactions  . Latex Itching    Social History   Tobacco Use  . Smoking status: Current Every Day Smoker    Packs/day: 0.00    Last attempt to quit: 11/25/2015    Years since quitting: 5.3  . Smokeless tobacco: Never Used  . Tobacco comment: 4 cigs/day  Substance Use Topics  . Alcohol use: No    Alcohol/week: 0.0 standard drinks    Family History  Problem Relation Age of Onset  . Cancer Mother   . Breast cancer Mother   . Diabetes Father   . Cancer Sister   . Breast cancer Sister   . Alzheimer's disease Paternal Grandmother   . Anuerysm Other        Paternal family Hx      Review of Systems  Constitutional: negative for fatigue and weight loss Respiratory: negative for cough and wheezing Cardiovascular: negative for chest pain, fatigue and palpitations Gastrointestinal: negative for abdominal pain and change in bowel habits Musculoskeletal:negative for myalgias Neurological: negative for gait problems and tremors Behavioral/Psych: negative for abusive relationship, depression Endocrine: negative for temperature intolerance    Genitourinary:positive for spotting x 1 and vaginal discharge.  negative for abnormal menstrual periods, genital lesions, hot flashes, sexual  problems  Integument/breast: negative for breast lump, breast tenderness, nipple discharge and skin lesion(s)    Objective:       BP 117/81   Pulse 76   Ht $R'5\' 4"'eZ$  (1.626 m)   Wt 122 lb (55.3 kg)   BMI 20.94 kg/m  General:   alert and no distress  Skin:   no rash or abnormalities  Lungs:   clear to auscultation bilaterally  Heart:   regular rate and rhythm, S1, S2 normal, no murmur, click, rub or gallop  Breasts:   normal without suspicious masses, skin or nipple changes or axillary nodes  Abdomen:  normal findings: no organomegaly, soft, non-tender and no hernia  Pelvis:  External  genitalia: normal general appearance Urinary system: urethral meatus normal and bladder without fullness, nontender Vaginal: normal without tenderness, induration or masses Cervix: normal appearance Adnexa: normal bimanual exam Uterus: anteverted and non-tender, normal size   Lab Review Urine pregnancy test Labs reviewed yes Radiologic studies reviewed yes  I have spent a total of 20 minutes of face-to-face time, excluding clinical staff time, reviewing notes and preparing to see patient, ordering tests and/or medications, and counseling the patient.  Assessment:     1. Encounter for gynecological examination with Papanicolaou smear of cervix Rx: - Cytology - PAP( [facility])  2. Vaginal discharge Rx: - Cervicovaginal ancillary only( [facility])  3. Screen for STD (sexually transmitted disease) Rx: - RPR+HBsAg+HCVAb+...  4. Screening breast examination Rx: - MM Digital Screening; Future  5. Tobacco dependence - cessation with the aid of medication and behavioral modification recommended    Plan:    Education reviewed: calcium supplements, depression evaluation, low fat, low cholesterol diet, safe sex/STD prevention, self breast exams, smoking cessation and weight bearing exercise. Mammogram ordered. Follow up in: 1 year.    Orders Placed This Encounter  Procedures  . MM Digital Screening    Standing Status:   Future    Standing Expiration Date:   03/22/2022    Order Specific Question:   Reason for Exam (SYMPTOM  OR DIAGNOSIS REQUIRED)    Answer:   screening    Order Specific Question:   Is the patient pregnant?    Answer:   No    Order Specific Question:   Preferred imaging location?    Answer:   [facility]  . RPR+HBsAg+HCVAb+...    Shelly Bombard, MD 03/22/2021 10:28 AM

## 2021-03-23 LAB — RPR+HBSAG+HCVAB+...
HIV Screen 4th Generation wRfx: NONREACTIVE
Hep C Virus Ab: <0.1 s/co ratio (ref 0.0–0.9)
Hepatitis B Surface Ag: NEGATIVE
RPR Ser Ql: NONREACTIVE

## 2021-03-27 LAB — CYTOLOGY - PAP
Comment: NEGATIVE
High risk HPV: POSITIVE — AB

## 2021-04-04 ENCOUNTER — Telehealth: Payer: Self-pay

## 2021-04-04 NOTE — Telephone Encounter (Signed)
Received message from patient- she reported having some brown discharge with stomach cramps. Call patient back no answer or voice mail.

## 2021-04-05 ENCOUNTER — Encounter (HOSPITAL_COMMUNITY): Payer: Self-pay

## 2021-04-05 ENCOUNTER — Emergency Department (HOSPITAL_COMMUNITY)
Admission: EM | Admit: 2021-04-05 | Discharge: 2021-04-05 | Disposition: A | Discharge status: Home / Self Care | Payer: BC Managed Care – PPO | Attending: Emergency Medicine | Admitting: Emergency Medicine

## 2021-04-05 ENCOUNTER — Other Ambulatory Visit: Payer: Self-pay

## 2021-04-05 ENCOUNTER — Emergency Department (HOSPITAL_COMMUNITY): Payer: BC Managed Care – PPO

## 2021-04-05 DIAGNOSIS — R10A0 Flank pain, unspecified side: Secondary | ICD-10-CM

## 2021-04-05 DIAGNOSIS — Z91040 Latex allergy status: Secondary | ICD-10-CM | POA: Diagnosis not present

## 2021-04-05 DIAGNOSIS — F17200 Nicotine dependence, unspecified, uncomplicated: Secondary | ICD-10-CM | POA: Insufficient documentation

## 2021-04-05 DIAGNOSIS — R109 Unspecified abdominal pain: Secondary | ICD-10-CM | POA: Insufficient documentation

## 2021-04-05 DIAGNOSIS — R0602 Shortness of breath: Secondary | ICD-10-CM | POA: Diagnosis not present

## 2021-04-05 DIAGNOSIS — R3129 Other microscopic hematuria: Secondary | ICD-10-CM | POA: Diagnosis not present

## 2021-04-05 LAB — URINALYSIS, ROUTINE W REFLEX MICROSCOPIC
Bilirubin Urine: NEGATIVE
Glucose, UA: NEGATIVE mg/dL
Hgb urine dipstick: NEGATIVE
Ketones, ur: NEGATIVE mg/dL
Leukocytes,Ua: NEGATIVE
Nitrite: NEGATIVE
Protein, ur: NEGATIVE mg/dL
Specific Gravity, Urine: 1.02 (ref 1.005–1.030)
pH: 7 (ref 5.0–8.0)

## 2021-04-05 LAB — CBC WITH DIFFERENTIAL/PLATELET
Abs Immature Granulocytes: 0.03 10*3/uL (ref 0.00–0.07)
Basophils Absolute: 0.1 10*3/uL (ref 0.0–0.1)
Basophils Relative: 1 %
Eosinophils Absolute: 0.4 10*3/uL (ref 0.0–0.5)
Eosinophils Relative: 3 %
HCT: 36.4 % (ref 36.0–46.0)
Hemoglobin: 12 g/dL (ref 12.0–15.0)
Immature Granulocytes: 0 %
Lymphocytes Relative: 29 %
Lymphs Abs: 3.3 10*3/uL (ref 0.7–4.0)
MCH: 23.3 pg — ABNORMAL LOW (ref 26.0–34.0)
MCHC: 33 g/dL (ref 30.0–36.0)
MCV: 70.5 fL — ABNORMAL LOW (ref 80.0–100.0)
Monocytes Absolute: 0.7 10*3/uL (ref 0.1–1.0)
Monocytes Relative: 7 %
Neutro Abs: 6.8 10*3/uL (ref 1.7–7.7)
Neutrophils Relative %: 60 %
Platelets: 370 10*3/uL (ref 150–400)
RBC: 5.16 MIL/uL — ABNORMAL HIGH (ref 3.87–5.11)
RDW: 14 % (ref 11.5–15.5)
WBC: 11.4 10*3/uL — ABNORMAL HIGH (ref 4.0–10.5)
nRBC: 0 % (ref 0.0–0.2)

## 2021-04-05 LAB — I-STAT BETA HCG BLOOD, ED (MC, WL, AP ONLY): I-stat hCG, quantitative: <5 m[IU]/mL (ref <5)

## 2021-04-05 LAB — I-STAT CHEM 8, ED
BUN: 12 mg/dL (ref 6–20)
Calcium, Ion: 1.16 mmol/L (ref 1.15–1.40)
Chloride: 104 mmol/L (ref 98–111)
Creatinine, Ser: 0.5 mg/dL (ref 0.44–1.00)
Glucose, Bld: 84 mg/dL (ref 70–99)
HCT: 38 % (ref 36.0–46.0)
Hemoglobin: 12.9 g/dL (ref 12.0–15.0)
Potassium: 4.1 mmol/L (ref 3.5–5.1)
Sodium: 140 mmol/L (ref 135–145)
TCO2: 25 mmol/L (ref 22–32)

## 2021-04-05 MED ORDER — KETOROLAC TROMETHAMINE 30 MG/ML IJ SOLN
30.0000 mg | Freq: Once | INTRAMUSCULAR | Status: AC
  Administered 2021-04-05: 30 mg via INTRAVENOUS

## 2021-04-05 MED ORDER — LIDOCAINE 5 % EX PTCH: 1.0000 | MEDICATED_PATCH | CUTANEOUS | 0 refills | Status: AC

## 2021-04-05 MED ORDER — LIDOCAINE 5 % EX PTCH
2.0000 | MEDICATED_PATCH | CUTANEOUS | Status: DC
  Administered 2021-04-05: 2 via TRANSDERMAL
  Filled 2021-04-05: qty 2

## 2021-04-05 MED ORDER — KETOROLAC TROMETHAMINE 60 MG/2ML IM SOLN
60.0000 mg | Freq: Once | INTRAMUSCULAR | Status: DC
  Filled 2021-04-05: qty 2

## 2021-04-05 MED ORDER — ACETAMINOPHEN 500 MG PO TABS
1000.0000 mg | ORAL_TABLET | Freq: Once | ORAL | Status: AC
  Administered 2021-04-05: 1000 mg via ORAL
  Filled 2021-04-05: qty 2

## 2021-04-05 MED ORDER — NAPROXEN 375 MG PO TABS: 375.0000 mg | ORAL_TABLET | Freq: Two times a day (BID) | ORAL | 0 refills | Status: AC

## 2021-04-05 NOTE — ED Triage Notes (Signed)
Pt reports left sided flank pain that's progressively getting worse and nausea for 1 week.

## 2021-04-05 NOTE — ED Provider Notes (Signed)
[facility] DEPT Provider Note   CSN: [id number] Arrival date & time: 04/05/21  0026     History Chief Complaint  Patient presents with  . Flank Pain    Erika [name] is a 40 y.o. female.  The history is provided by the patient.  Flank Pain This is a new problem. The current episode started more than 1 week ago. The problem occurs constantly. The problem has not changed since onset.Pertinent negatives include no chest pain, no abdominal pain, no headaches and no shortness of breath. Nothing aggravates the symptoms. Nothing relieves the symptoms. She has tried nothing for the symptoms. The treatment provided no relief.  Left flank pain without radiation x >1 week. No urinary symptoms.  No vomiting.  No diarrhea.  Patient denies trauma.       Past Medical History:  Diagnosis Date  . Anemia   . Menorrhagia 07/2017    Patient Active Problem List   Diagnosis Date Noted  . Family history of breast cancer in mother 07/21/2017  . Pelvic pain in female 07/21/2017    Past Surgical History:  Procedure Laterality Date  . ENDOMETRIAL ABLATION N/A 08/27/2017   Procedure: ENDOMETRIAL ABLATION WITH NOVASURE;  Surgeon: Woodroe Mode, MD;  Location: [facility];  Service: Gynecology;  Laterality: N/A;  . IUD REMOVAL  09/07/2007  . LEEP  04/22/2008  . TUBAL LIGATION  09/07/2007     OB History    Gravida  3   Para  2   Term  2   Preterm      AB  1   Living  2     SAB  1   IAB      Ectopic      Multiple      Live Births  2           Family History  Problem Relation Age of Onset  . Cancer Mother   . Breast cancer Mother   . Diabetes Father   . Cancer Sister   . Breast cancer Sister   . Alzheimer's disease Paternal Grandmother   . Anuerysm Other        Paternal family Hx    Social History   Tobacco Use  . Smoking status: Current Every Day Smoker    Packs/day: 0.00    Last attempt to quit: 11/25/2015     Years since quitting: 5.3  . Smokeless tobacco: Never Used  . Tobacco comment: 4 cigs/day  Vaping Use  . Vaping Use: Never used  Substance Use Topics  . Alcohol use: No    Alcohol/week: 0.0 standard drinks  . Drug use: No    Home Medications Prior to Admission medications   Not on File    Allergies    Latex  Review of Systems   Review of Systems  Constitutional: Negative for fever.  HENT: Negative for congestion.   Eyes: Negative for visual disturbance.  Respiratory: Negative for shortness of breath.   Cardiovascular: Negative for chest pain.  Gastrointestinal: Negative for abdominal pain.  Genitourinary: Positive for flank pain. Negative for dysuria and hematuria.  Musculoskeletal: Negative for arthralgias.  Skin: Negative for rash.  Neurological: Negative for headaches.  Psychiatric/Behavioral: Negative for agitation.  All other systems reviewed and are negative.   Physical Exam Updated Vital Signs BP (!) 164/98   Pulse 87   Temp 98.7 F (37.1 C) (Oral)   Resp 17   Ht 5\' 4"  (1.626 m)  Wt 55.3 kg   SpO2 100%   BMI 20.94 kg/m   Physical Exam Vitals and nursing note reviewed.  Constitutional:      General: She is not in acute distress.    Appearance: Normal appearance.  HENT:     Head: Normocephalic and atraumatic.     Nose: Nose normal.  Eyes:     Conjunctiva/sclera: Conjunctivae normal.     Pupils: Pupils are equal, round, and reactive to light.  Cardiovascular:     Rate and Rhythm: Normal rate and regular rhythm.     Pulses: Normal pulses.     Heart sounds: Normal heart sounds.  Pulmonary:     Effort: Pulmonary effort is normal.     Breath sounds: Normal breath sounds.  Abdominal:     General: Abdomen is flat. Bowel sounds are normal.     Palpations: Abdomen is soft.     Tenderness: There is no abdominal tenderness. There is no guarding.  Musculoskeletal:        General: Normal range of motion.     Cervical back: Normal range of motion and  neck supple.  Skin:    General: Skin is warm and dry.     Capillary Refill: Capillary refill takes less than 2 seconds.  Neurological:     General: No focal deficit present.     Mental Status: She is alert and oriented to person, place, and time.     Deep Tendon Reflexes: Reflexes normal.  Psychiatric:        Mood and Affect: Mood normal.        Behavior: Behavior normal.     ED Results / Procedures / Treatments   Labs (all labs ordered are listed, but only abnormal results are displayed) Results for orders placed or performed during the hospital encounter of 04/05/21  Urinalysis, Routine w reflex microscopic  Result Value Ref Range   Color, Urine YELLOW YELLOW   APPearance CLEAR CLEAR   Specific Gravity, Urine 1.020 1.005 - 1.030   pH 7.0 5.0 - 8.0   Glucose, UA NEGATIVE NEGATIVE mg/dL   Hgb urine dipstick NEGATIVE NEGATIVE   Bilirubin Urine NEGATIVE NEGATIVE   Ketones, ur NEGATIVE NEGATIVE mg/dL   Protein, ur NEGATIVE NEGATIVE mg/dL   Nitrite NEGATIVE NEGATIVE   Leukocytes,Ua NEGATIVE NEGATIVE  CBC with Differential/Platelet  Result Value Ref Range   WBC 11.4 (H) 4.0 - 10.5 K/uL   RBC 5.16 (H) 3.87 - 5.11 MIL/uL   Hemoglobin 12.0 12.0 - 15.0 g/dL   HCT 36.4 36.0 - 46.0 %   MCV 70.5 (L) 80.0 - 100.0 fL   MCH 23.3 (L) 26.0 - 34.0 pg   MCHC 33.0 30.0 - 36.0 g/dL   RDW 14.0 11.5 - 15.5 %   Platelets 370 150 - 400 K/uL   nRBC 0.0 0.0 - 0.2 %   Neutrophils Relative % 60 %   Neutro Abs 6.8 1.7 - 7.7 K/uL   Lymphocytes Relative 29 %   Lymphs Abs 3.3 0.7 - 4.0 K/uL   Monocytes Relative 7 %   Monocytes Absolute 0.7 0.1 - 1.0 K/uL   Eosinophils Relative 3 %   Eosinophils Absolute 0.4 0.0 - 0.5 K/uL   Basophils Relative 1 %   Basophils Absolute 0.1 0.0 - 0.1 K/uL   Immature Granulocytes 0 %   Abs Immature Granulocytes 0.03 0.00 - 0.07 K/uL  I-Stat Beta hCG blood, ED ([facility], [facility], [facility] only)  Result Value Ref Range   I-stat hCG,  quantitative <5.0 <5 mIU/mL   Comment 3           I-stat chem 8, ED (not at [facility] or [facility])  Result Value Ref Range   Sodium 140 135 - 145 mmol/L   Potassium 4.1 3.5 - 5.1 mmol/L   Chloride 104 98 - 111 mmol/L   BUN 12 6 - 20 mg/dL   Creatinine, Ser 0.50 0.44 - 1.00 mg/dL   Glucose, Bld 84 70 - 99 mg/dL   Calcium, Ion 1.16 1.15 - 1.40 mmol/L   TCO2 25 22 - 32 mmol/L   Hemoglobin 12.9 12.0 - 15.0 g/dL   HCT 38.0 36.0 - 46.0 %   CT Renal Stone Study  Result Date: 04/05/2021 CLINICAL DATA:  Flank pain, kidney stone suspected. White blood cell 11.4. Microhematuria negative. EXAM: CT ABDOMEN AND PELVIS WITHOUT CONTRAST TECHNIQUE: Multidetector CT imaging of the abdomen and pelvis was performed following the standard protocol without IV contrast. COMPARISON:  None. FINDINGS: Lower chest: Unremarkable. Hepatobiliary: No focal liver abnormality. No gallstones, gallbladder wall thickening, or pericholecystic fluid. No biliary dilatation. Pancreas: No focal lesion. Normal pancreatic contour. No surrounding inflammatory changes. No main pancreatic ductal dilatation. Spleen: Normal in size without focal abnormality. Adrenals/Urinary Tract: No adrenal nodule bilaterally. No nephrolithiasis, no hydronephrosis, and no contour-deforming renal mass. No ureterolithiasis or hydroureter. The urinary bladder is unremarkable. Stomach/Bowel: Stomach is within normal limits. No evidence of bowel wall thickening or dilatation. Appendix appears normal. Vascular/Lymphatic: No abdominal aorta or iliac aneurysm. No abdominal, pelvic, or inguinal lymphadenopathy. Reproductive: Uterus and bilateral adnexa are unremarkable. Other: No intraperitoneal free fluid. No intraperitoneal free gas. No organized fluid collection. Musculoskeletal: No abdominal wall hernia or abnormality. No suspicious lytic or blastic osseous lesions. No acute displaced fracture. IMPRESSION: No acute intra-abdominal or intrapelvic abnormality on this noncontrast study. Electronically Signed   By: Iven Finn M.D.   On: 04/05/2021 04:23    Radiology CT Renal Stone Study  Result Date: 04/05/2021 CLINICAL DATA:  Flank pain, kidney stone suspected. White blood cell 11.4. Microhematuria negative. EXAM: CT ABDOMEN AND PELVIS WITHOUT CONTRAST TECHNIQUE: Multidetector CT imaging of the abdomen and pelvis was performed following the standard protocol without IV contrast. COMPARISON:  None. FINDINGS: Lower chest: Unremarkable. Hepatobiliary: No focal liver abnormality. No gallstones, gallbladder wall thickening, or pericholecystic fluid. No biliary dilatation. Pancreas: No focal lesion. Normal pancreatic contour. No surrounding inflammatory changes. No main pancreatic ductal dilatation. Spleen: Normal in size without focal abnormality. Adrenals/Urinary Tract: No adrenal nodule bilaterally. No nephrolithiasis, no hydronephrosis, and no contour-deforming renal mass. No ureterolithiasis or hydroureter. The urinary bladder is unremarkable. Stomach/Bowel: Stomach is within normal limits. No evidence of bowel wall thickening or dilatation. Appendix appears normal. Vascular/Lymphatic: No abdominal aorta or iliac aneurysm. No abdominal, pelvic, or inguinal lymphadenopathy. Reproductive: Uterus and bilateral adnexa are unremarkable. Other: No intraperitoneal free fluid. No intraperitoneal free gas. No organized fluid collection. Musculoskeletal: No abdominal wall hernia or abnormality. No suspicious lytic or blastic osseous lesions. No acute displaced fracture. IMPRESSION: No acute intra-abdominal or intrapelvic abnormality on this noncontrast study. Electronically Signed   By: Iven Finn M.D.   On: 04/05/2021 04:23    Procedures Procedures   Medications Ordered in ED Medications  lidocaine (LIDODERM) 5 % 2 patch (has no administration in time range)  acetaminophen (TYLENOL) tablet 1,000 mg (has no administration in time range)  ketorolac (TORADOL) 30 MG/ML injection 30 mg (has no administration in time range)     ED Course  I have reviewed the triage vital signs and the nursing notes.  Pertinent labs & imaging results that were available during my care of the patient were reviewed by me and considered in my medical decision making (see chart for details).   Patient with likely MSK flank pain. No stones or acute finding on CT scan alternate tylenol and NSAIDS, will add lidoderm.      Erika [name] was evaluated in Emergency Department on 04/05/2021 for the symptoms described in the history of present illness. She was evaluated in the context of the global COVID-19 pandemic, which necessitated consideration that the patient might be at risk for infection with the SARS-CoV-2 virus that causes COVID-19. Institutional protocols and algorithms that pertain to the evaluation of patients at risk for COVID-19 are in a state of rapid change based on information released by regulatory bodies including the CDC and federal and state organizations. These policies and algorithms were followed during the patient's care in the ED.   Final Clinical Impression(s) / ED Diagnoses Final diagnoses:  Flank pain   Return for intractable cough, coughing up blood, fevers >100.4 unrelieved by medication, shortness of breath, intractable vomiting, chest pain, shortness of breath, weakness, numbness, changes in speech, facial asymmetry, abdominal pain, passing out, Inability to tolerate liquids or food, cough, altered mental status or any concerns. No signs of systemic illness or infection. The patient is nontoxic-appearing on exam and vital signs are within normal limits.  I have reviewed the triage vital signs and the nursing notes. Pertinent labs & imaging results that were available during my care of the patient were reviewed by me and considered in my medical decision making (see chart for details). After history, exam, and medical workup I feel the patient has been appropriately medically screened and is safe for discharge  home. Pertinent diagnoses were discussed with the patient. Patient was given return precautions.    Babbette Dalesandro, MD 04/05/21 [phone]

## 2021-04-05 NOTE — ED Notes (Signed)
Pt discharged from this ED in stable condition at this time. All discharge instructions and follow up care reviewed with pt with no further questions at this time. Pt ambulatory with steady gait, clear speech.  

## 2021-04-12 ENCOUNTER — Other Ambulatory Visit (HOSPITAL_COMMUNITY)
Admission: RE | Admit: 2021-04-12 | Discharge: 2021-04-12 | Disposition: A | Discharge status: Home / Self Care | Payer: BC Managed Care – PPO | Source: Ambulatory Visit | Attending: Obstetrics | Admitting: Obstetrics

## 2021-04-12 ENCOUNTER — Encounter: Payer: Self-pay | Admitting: Obstetrics

## 2021-04-12 ENCOUNTER — Other Ambulatory Visit: Payer: Self-pay

## 2021-04-12 ENCOUNTER — Ambulatory Visit (INDEPENDENT_AMBULATORY_CARE_PROVIDER_SITE_OTHER): Payer: BC Managed Care – PPO | Admitting: Obstetrics

## 2021-04-12 VITALS — BP 113/77 | HR 83 | Wt 125.0 lb

## 2021-04-12 DIAGNOSIS — R87612 Low grade squamous intraepithelial lesion on cytologic smear of cervix (LGSIL): Secondary | ICD-10-CM | POA: Insufficient documentation

## 2021-04-12 DIAGNOSIS — R87618 Other abnormal cytological findings on specimens from cervix uteri: Secondary | ICD-10-CM | POA: Insufficient documentation

## 2021-04-12 DIAGNOSIS — N879 Dysplasia of cervix uteri, unspecified: Secondary | ICD-10-CM | POA: Diagnosis not present

## 2021-04-12 NOTE — Progress Notes (Signed)
RGYN pt present for colpo following abnormal pap.  Last pap:03/22/21 LSIL + HPV  Pt denies any intercourse In last x 14 days.  [id number]

## 2021-04-12 NOTE — Progress Notes (Signed)
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 The prior pap showed no abnormalities.  Prior cervical/vaginal disease: HGSIL. Prior cervical treatment: LEEP.  Procedure Details  The risks and benefits of the procedure and Written informed consent obtained.  A time-out was performed confirming the patient, procedure and allergy status  Speculum placed in vagina and excellent visualization of cervix achieved, cervix swabbed x 3 with acetic acid solution.  Findings: Cervix: no visible lesions, no mosaicism, no punctation and no abnormal vasculature; SCJ visualized 360 degrees without lesions, endocervical curettage performed, cervical biopsies taken at 6 and 12 o'clock, specimen labelled and sent to pathology and hemostasis achieved with silver nitrate.   Vaginal inspection: normal without visible lesions. Vulvar colposcopy: vulvar colposcopy not performed.   Physical Exam   Specimens: ECC and cervical biopsies  Complications: none.  Plan: Specimens labelled and sent to Pathology. Will base further treatment on Pathology findings. Treatment options discussed with patient. Post biopsy instructions given to patient. Return to discuss Pathology results in 2 weeks   Shelly Bombard, MD 04/12/2021 2:02 PM.

## 2021-04-16 LAB — SURGICAL PATHOLOGY

## 2021-04-17 ENCOUNTER — Telehealth: Payer: Self-pay

## 2021-04-17 NOTE — Telephone Encounter (Signed)
TC to pt to make aware of results @ 3:38 pm 04/17/21 no answer LVM.

## 2021-04-17 NOTE — Telephone Encounter (Signed)
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  Pap smear is LGSIL.  Repeat pap in 1 year.

## 2021-04-18 ENCOUNTER — Telehealth: Payer: Self-pay

## 2021-04-18 NOTE — Telephone Encounter (Signed)
Pt made aware of recent colpo results  Pt advised to repeat pap in 72yr.

## 2021-06-17 DIAGNOSIS — R1110 Vomiting, unspecified: Secondary | ICD-10-CM | POA: Diagnosis not present

## 2021-06-17 DIAGNOSIS — R109 Unspecified abdominal pain: Secondary | ICD-10-CM | POA: Diagnosis not present

## 2021-06-17 DIAGNOSIS — R112 Nausea with vomiting, unspecified: Secondary | ICD-10-CM | POA: Diagnosis not present

## 2021-06-17 DIAGNOSIS — R197 Diarrhea, unspecified: Secondary | ICD-10-CM | POA: Diagnosis not present

## 2021-06-28 DIAGNOSIS — M47816 Spondylosis without myelopathy or radiculopathy, lumbar region: Secondary | ICD-10-CM | POA: Diagnosis not present

## 2021-06-28 DIAGNOSIS — M545 Low back pain, unspecified: Secondary | ICD-10-CM | POA: Diagnosis not present

## 2021-07-13 DIAGNOSIS — M545 Low back pain, unspecified: Secondary | ICD-10-CM | POA: Diagnosis not present

## 2021-07-13 DIAGNOSIS — M4126 Other idiopathic scoliosis, lumbar region: Secondary | ICD-10-CM | POA: Diagnosis not present

## 2021-07-16 DIAGNOSIS — M4126 Other idiopathic scoliosis, lumbar region: Secondary | ICD-10-CM | POA: Diagnosis not present

## 2021-07-16 DIAGNOSIS — M545 Low back pain, unspecified: Secondary | ICD-10-CM | POA: Diagnosis not present

## 2021-07-24 DIAGNOSIS — K5904 Chronic idiopathic constipation: Secondary | ICD-10-CM | POA: Diagnosis not present

## 2021-07-24 DIAGNOSIS — R634 Abnormal weight loss: Secondary | ICD-10-CM | POA: Diagnosis not present

## 2021-07-24 DIAGNOSIS — R1032 Left lower quadrant pain: Secondary | ICD-10-CM | POA: Diagnosis not present

## 2021-07-27 DIAGNOSIS — M4126 Other idiopathic scoliosis, lumbar region: Secondary | ICD-10-CM | POA: Diagnosis not present

## 2021-07-27 DIAGNOSIS — M545 Low back pain, unspecified: Secondary | ICD-10-CM | POA: Diagnosis not present

## 2021-08-06 DIAGNOSIS — R1032 Left lower quadrant pain: Secondary | ICD-10-CM | POA: Diagnosis not present

## 2021-08-06 DIAGNOSIS — F17210 Nicotine dependence, cigarettes, uncomplicated: Secondary | ICD-10-CM | POA: Diagnosis not present

## 2021-08-06 DIAGNOSIS — M7918 Myalgia, other site: Secondary | ICD-10-CM | POA: Diagnosis not present

## 2021-08-06 DIAGNOSIS — S3992XA Unspecified injury of lower back, initial encounter: Secondary | ICD-10-CM | POA: Diagnosis not present

## 2021-08-06 DIAGNOSIS — M5416 Radiculopathy, lumbar region: Secondary | ICD-10-CM | POA: Diagnosis not present

## 2021-08-06 DIAGNOSIS — R1012 Left upper quadrant pain: Secondary | ICD-10-CM | POA: Diagnosis not present

## 2021-08-07 DIAGNOSIS — S3992XA Unspecified injury of lower back, initial encounter: Secondary | ICD-10-CM | POA: Diagnosis not present

## 2021-08-10 DIAGNOSIS — M4126 Other idiopathic scoliosis, lumbar region: Secondary | ICD-10-CM | POA: Diagnosis not present

## 2021-08-10 DIAGNOSIS — M545 Low back pain, unspecified: Secondary | ICD-10-CM | POA: Diagnosis not present

## 2021-08-12 DIAGNOSIS — M5416 Radiculopathy, lumbar region: Secondary | ICD-10-CM | POA: Diagnosis not present

## 2021-08-12 DIAGNOSIS — F17210 Nicotine dependence, cigarettes, uncomplicated: Secondary | ICD-10-CM | POA: Diagnosis not present

## 2021-08-12 DIAGNOSIS — M545 Low back pain, unspecified: Secondary | ICD-10-CM | POA: Diagnosis not present

## 2021-08-16 DIAGNOSIS — M5416 Radiculopathy, lumbar region: Secondary | ICD-10-CM | POA: Diagnosis not present

## 2021-08-17 DIAGNOSIS — M4126 Other idiopathic scoliosis, lumbar region: Secondary | ICD-10-CM | POA: Diagnosis not present

## 2021-08-17 DIAGNOSIS — M545 Low back pain, unspecified: Secondary | ICD-10-CM | POA: Diagnosis not present

## 2021-08-19 DIAGNOSIS — M5416 Radiculopathy, lumbar region: Secondary | ICD-10-CM | POA: Diagnosis not present

## 2021-08-22 DIAGNOSIS — M5416 Radiculopathy, lumbar region: Secondary | ICD-10-CM | POA: Diagnosis not present

## 2021-08-28 DIAGNOSIS — F17210 Nicotine dependence, cigarettes, uncomplicated: Secondary | ICD-10-CM | POA: Diagnosis not present

## 2021-08-28 DIAGNOSIS — M545 Low back pain, unspecified: Secondary | ICD-10-CM | POA: Diagnosis not present

## 2021-08-28 DIAGNOSIS — M5442 Lumbago with sciatica, left side: Secondary | ICD-10-CM | POA: Diagnosis not present

## 2021-08-28 DIAGNOSIS — Z7952 Long term (current) use of systemic steroids: Secondary | ICD-10-CM | POA: Diagnosis not present

## 2021-08-28 DIAGNOSIS — M79605 Pain in left leg: Secondary | ICD-10-CM | POA: Diagnosis not present

## 2021-08-28 DIAGNOSIS — M5416 Radiculopathy, lumbar region: Secondary | ICD-10-CM | POA: Diagnosis not present

## 2021-09-02 DIAGNOSIS — M533 Sacrococcygeal disorders, not elsewhere classified: Secondary | ICD-10-CM | POA: Diagnosis not present

## 2021-09-02 DIAGNOSIS — M79605 Pain in left leg: Secondary | ICD-10-CM | POA: Diagnosis not present

## 2021-09-02 DIAGNOSIS — M79604 Pain in right leg: Secondary | ICD-10-CM | POA: Diagnosis not present

## 2021-09-02 DIAGNOSIS — F17210 Nicotine dependence, cigarettes, uncomplicated: Secondary | ICD-10-CM | POA: Diagnosis not present

## 2021-09-02 DIAGNOSIS — M5416 Radiculopathy, lumbar region: Secondary | ICD-10-CM | POA: Diagnosis not present

## 2021-09-06 DIAGNOSIS — R634 Abnormal weight loss: Secondary | ICD-10-CM | POA: Diagnosis not present

## 2021-09-06 DIAGNOSIS — D123 Benign neoplasm of transverse colon: Secondary | ICD-10-CM | POA: Diagnosis not present

## 2021-09-06 DIAGNOSIS — R194 Change in bowel habit: Secondary | ICD-10-CM | POA: Diagnosis not present

## 2021-09-06 DIAGNOSIS — R1032 Left lower quadrant pain: Secondary | ICD-10-CM | POA: Diagnosis not present

## 2021-09-06 DIAGNOSIS — K6289 Other specified diseases of anus and rectum: Secondary | ICD-10-CM | POA: Diagnosis not present

## 2021-09-06 DIAGNOSIS — K648 Other hemorrhoids: Secondary | ICD-10-CM | POA: Diagnosis not present

## 2021-09-06 DIAGNOSIS — K5904 Chronic idiopathic constipation: Secondary | ICD-10-CM | POA: Diagnosis not present

## 2021-09-24 DIAGNOSIS — M79652 Pain in left thigh: Secondary | ICD-10-CM | POA: Diagnosis not present

## 2021-09-24 DIAGNOSIS — M62830 Muscle spasm of back: Secondary | ICD-10-CM | POA: Diagnosis not present

## 2021-09-24 DIAGNOSIS — M5442 Lumbago with sciatica, left side: Secondary | ICD-10-CM | POA: Diagnosis not present

## 2021-09-24 DIAGNOSIS — M545 Low back pain, unspecified: Secondary | ICD-10-CM | POA: Diagnosis not present

## 2021-09-24 DIAGNOSIS — F17210 Nicotine dependence, cigarettes, uncomplicated: Secondary | ICD-10-CM | POA: Diagnosis not present

## 2021-10-04 DIAGNOSIS — M5442 Lumbago with sciatica, left side: Secondary | ICD-10-CM | POA: Diagnosis not present

## 2021-10-04 DIAGNOSIS — M79605 Pain in left leg: Secondary | ICD-10-CM | POA: Diagnosis not present

## 2021-10-04 DIAGNOSIS — M545 Low back pain, unspecified: Secondary | ICD-10-CM | POA: Diagnosis not present

## 2022-08-12 DIAGNOSIS — Z20822 Contact with and (suspected) exposure to covid-19: Secondary | ICD-10-CM | POA: Diagnosis not present

## 2022-08-20 DIAGNOSIS — M5432 Sciatica, left side: Secondary | ICD-10-CM | POA: Diagnosis not present

## 2022-09-09 DIAGNOSIS — M549 Dorsalgia, unspecified: Secondary | ICD-10-CM | POA: Diagnosis not present

## 2022-09-09 DIAGNOSIS — M5442 Lumbago with sciatica, left side: Secondary | ICD-10-CM | POA: Diagnosis not present

## 2022-09-09 DIAGNOSIS — M79605 Pain in left leg: Secondary | ICD-10-CM | POA: Diagnosis not present

## 2022-09-09 DIAGNOSIS — F17210 Nicotine dependence, cigarettes, uncomplicated: Secondary | ICD-10-CM | POA: Diagnosis not present

## 2023-02-04 IMAGING — CT CT RENAL STONE PROTOCOL
2 of 4 series · 17 of 46 positions shown, 19 images · non-contrast
Comparison: None.

CLINICAL DATA: Flank pain, kidney stone suspected. White blood cell
11.4. Microhematuria negative.

EXAM:
CT ABDOMEN AND PELVIS WITHOUT CONTRAST
TECHNIQUE: Multidetector CT imaging of the abdomen and pelvis was performed
following the standard protocol without IV contrast.

[Series 2: axial st · axial · 0.59mm/px · z∈[+1098,+1438]mm · 14 of 76 slices shown, 16 images]
[im 4/76  soft-tissue]
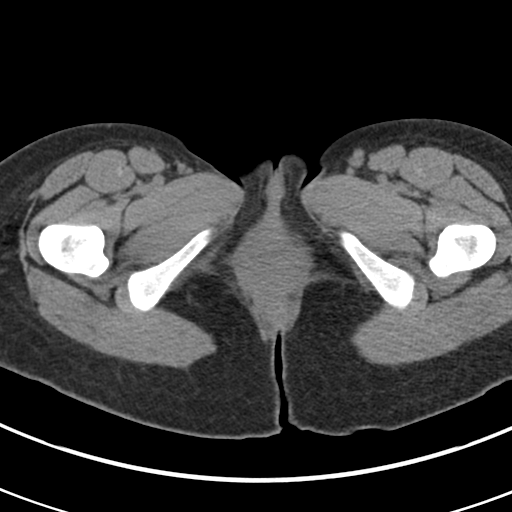
[im 4/76  bone]
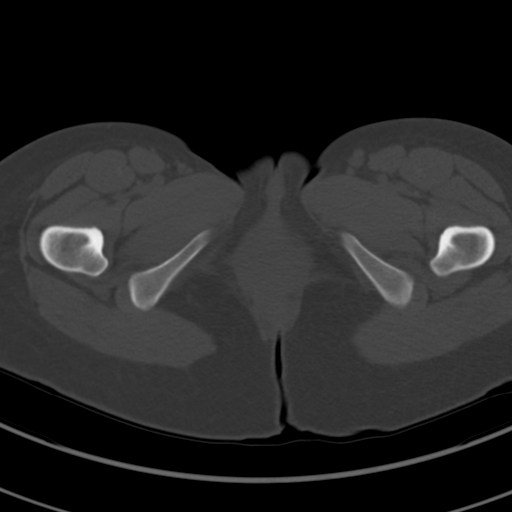
[im 11/76  soft-tissue]
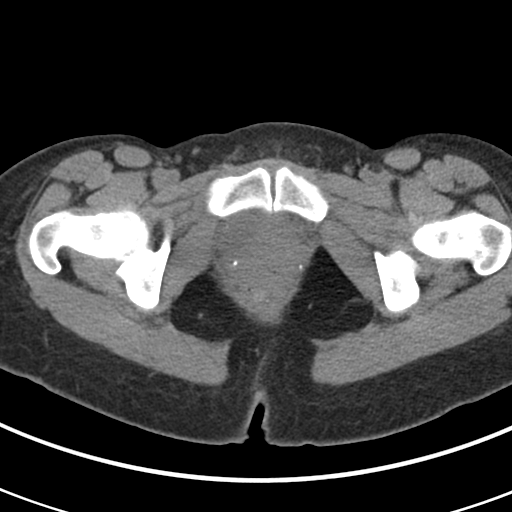
[im 15/76  soft-tissue]
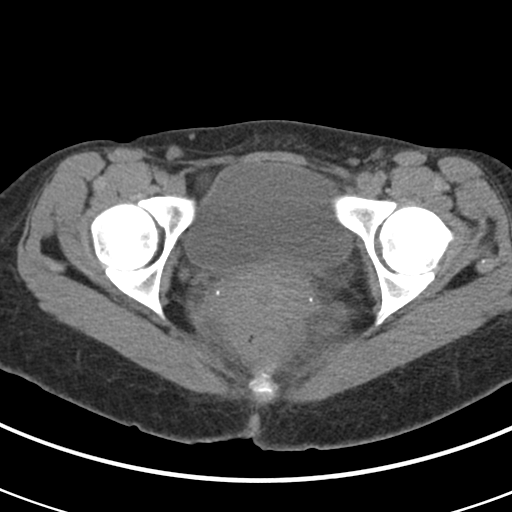
[im 22/76  soft-tissue]
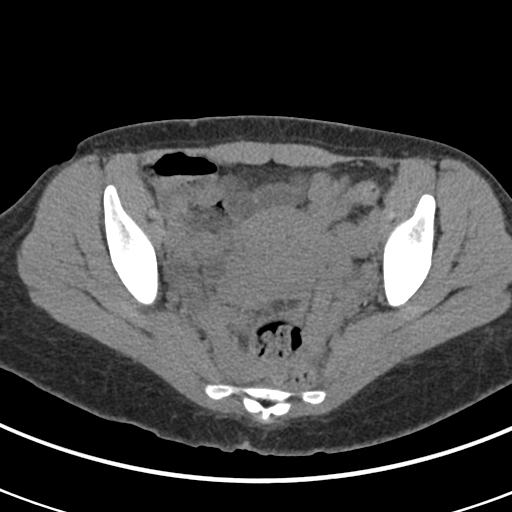
[im 26/76  soft-tissue]
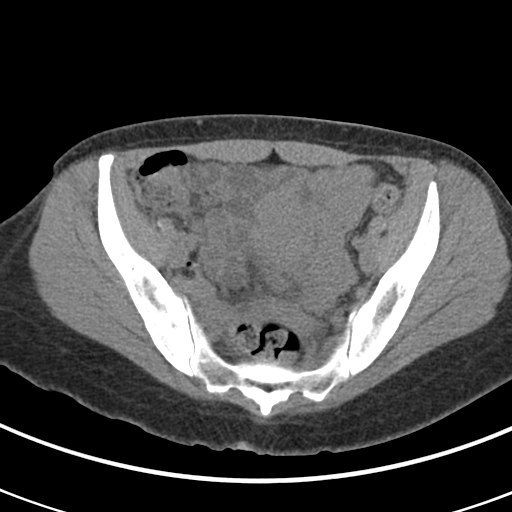
[im 29/76  soft-tissue]
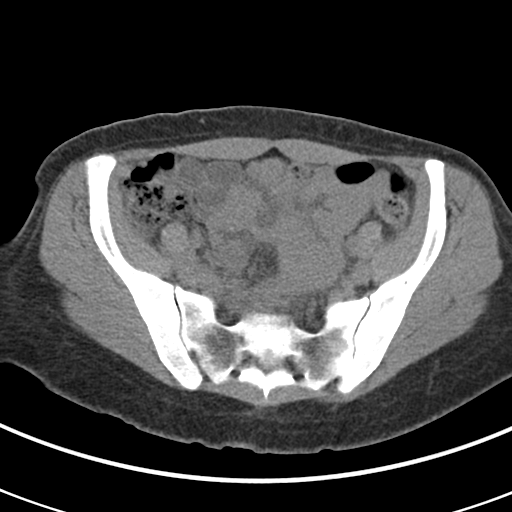
[im 36/76  soft-tissue]
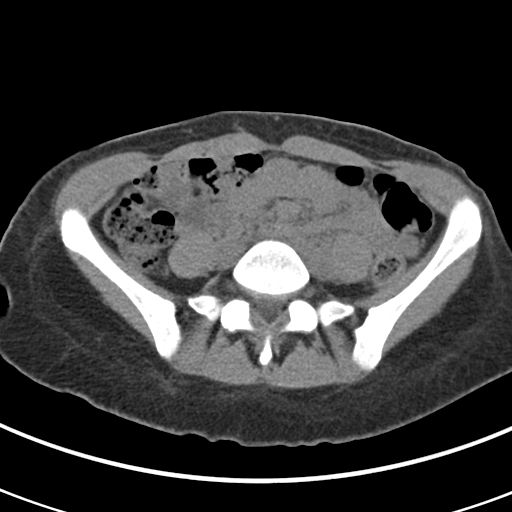
[im 40/76  soft-tissue]
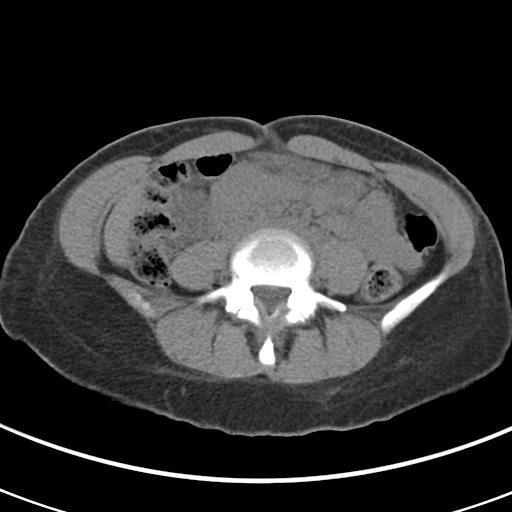
[im 47/76  soft-tissue]
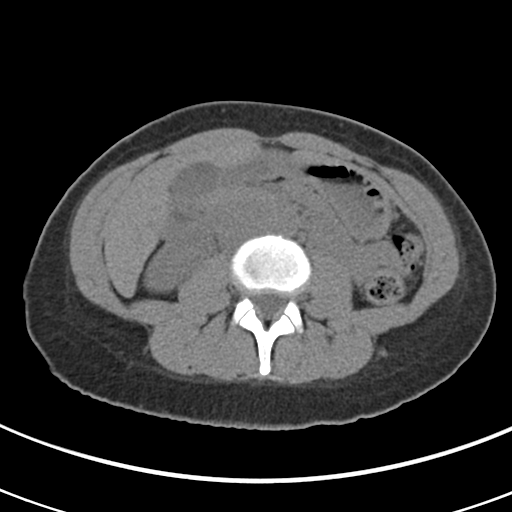
[im 47/76  bone]
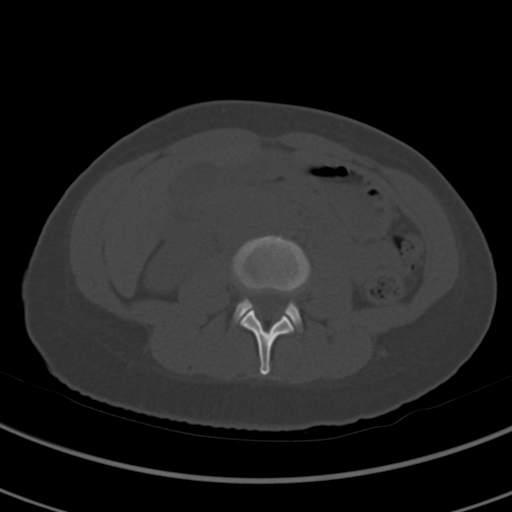
[im 51/76  soft-tissue]
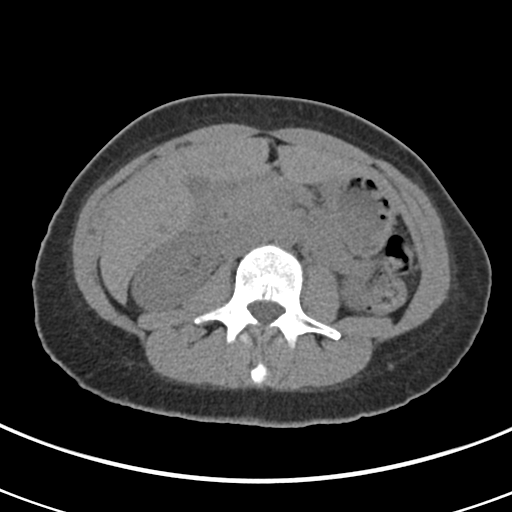
[im 58/76  soft-tissue]
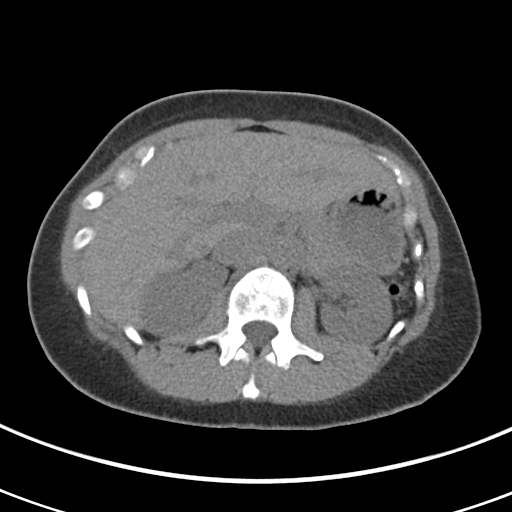
[im 61/76  soft-tissue]
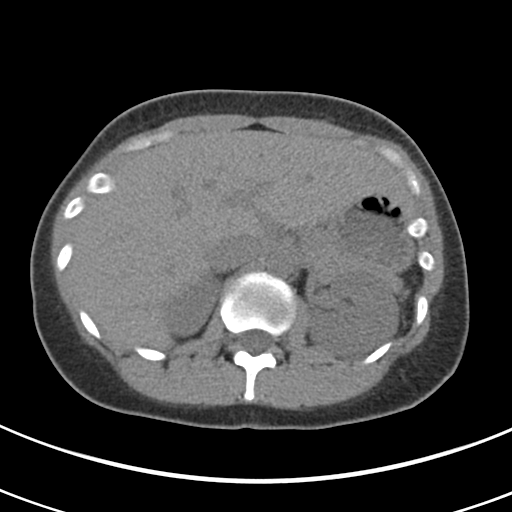
[im 65/76  soft-tissue]
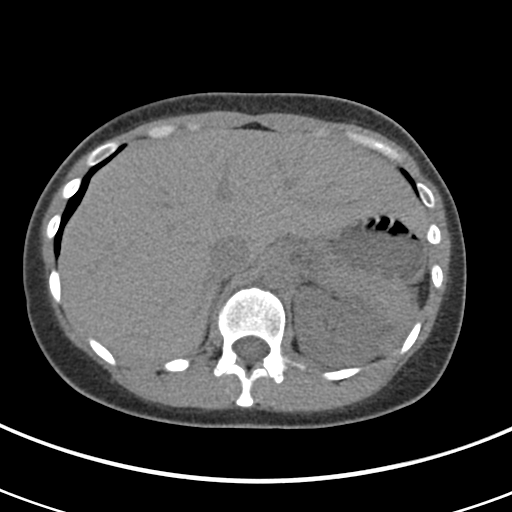
[im 72/76  soft-tissue]
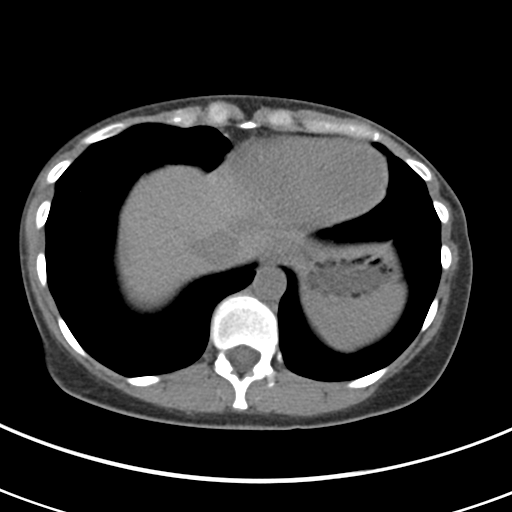

[Series 4: coronal · coronal · 0.73mm/px · 3 of 96 slices shown]
[im 32/96  soft-tissue]
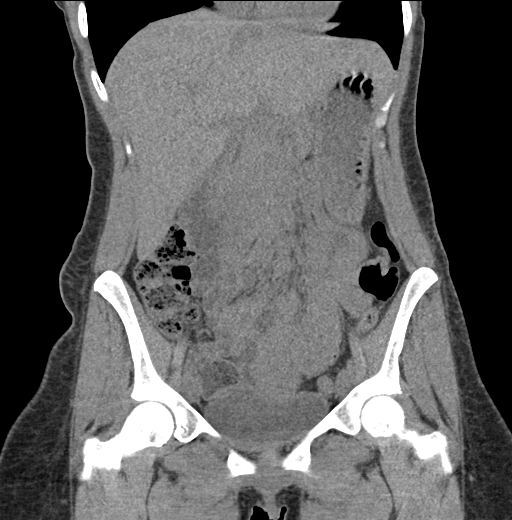
[im 43/96  soft-tissue]
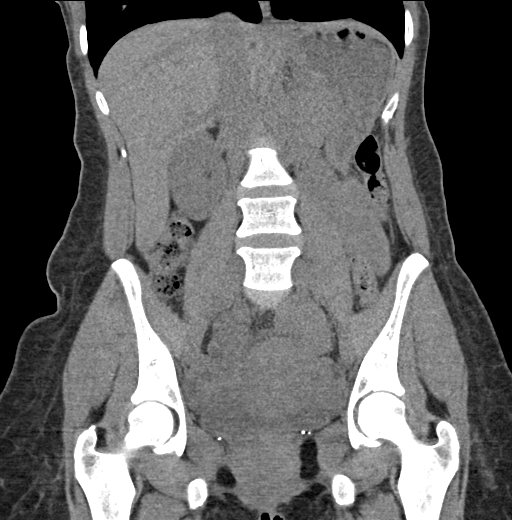
[im 53/96  soft-tissue]
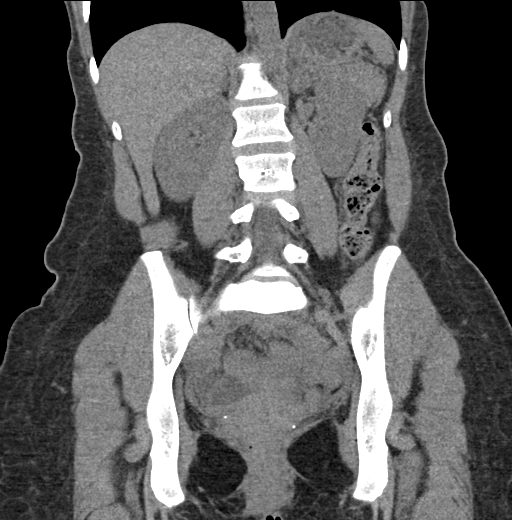

[17 of 46 positions shown; findings below may reference images not displayed]

FINDINGS: Lower chest: Unremarkable.

Hepatobiliary: No focal liver abnormality. No gallstones,
gallbladder wall thickening, or pericholecystic fluid. No biliary
dilatation.

Pancreas: No focal lesion. Normal pancreatic contour. No surrounding
inflammatory changes. No main pancreatic ductal dilatation.

Spleen: Normal in size without focal abnormality.

Adrenals/Urinary Tract:

No adrenal nodule bilaterally.

No nephrolithiasis, no hydronephrosis, and no contour-deforming
renal mass. No ureterolithiasis or hydroureter.

The urinary bladder is unremarkable.

Stomach/Bowel: Stomach is within normal limits. No evidence of bowel
wall thickening or dilatation. Appendix appears normal.

Vascular/Lymphatic: No abdominal aorta or iliac aneurysm. No
abdominal, pelvic, or inguinal lymphadenopathy.

Reproductive: Uterus and bilateral adnexa are unremarkable.

Other: No intraperitoneal free fluid. No intraperitoneal free gas.
No organized fluid collection.

Musculoskeletal:

No abdominal wall hernia or abnormality.

No suspicious lytic or blastic osseous lesions. No acute displaced
fracture.
IMPRESSION: No acute intra-abdominal or intrapelvic abnormality on this
noncontrast study.
